# Patient Record
Sex: Male | Born: 1950
Health system: Southern US, Community
[De-identification: ages and names within clinical notes are randomized; demographics above are authoritative.]

## PROBLEM LIST (undated history)

## (undated) DIAGNOSIS — L570 Actinic keratosis: Secondary | ICD-10-CM

## (undated) DIAGNOSIS — H409 Unspecified glaucoma: Secondary | ICD-10-CM

## (undated) HISTORY — DX: Unspecified glaucoma: H40.9

## (undated) HISTORY — DX: Actinic keratosis: L57.0

---

## 2006-03-02 ENCOUNTER — Ambulatory Visit: Payer: Self-pay | Admitting: Gastroenterology

## 2012-05-03 ENCOUNTER — Emergency Department: Payer: Self-pay | Admitting: Emergency Medicine

## 2012-10-10 ENCOUNTER — Emergency Department: Payer: Self-pay | Admitting: Emergency Medicine

## 2012-10-20 ENCOUNTER — Emergency Department: Payer: Self-pay

## 2012-11-20 ENCOUNTER — Emergency Department: Payer: Self-pay | Admitting: Emergency Medicine

## 2013-12-08 ENCOUNTER — Ambulatory Visit: Payer: Self-pay | Admitting: Gastroenterology

## 2013-12-14 LAB — PATHOLOGY REPORT

## 2014-04-12 ENCOUNTER — Ambulatory Visit: Payer: Self-pay | Admitting: Gastroenterology

## 2014-04-12 LAB — CREATININE, SERUM
Creatinine: 1.08 mg/dL (ref 0.60–1.30)
EGFR (African American): >60

## 2015-09-03 ENCOUNTER — Encounter: Payer: Self-pay | Admitting: Family Medicine

## 2015-09-03 ENCOUNTER — Other Ambulatory Visit: Payer: Self-pay | Admitting: Family Medicine

## 2015-09-03 NOTE — Telephone Encounter (Signed)
Letter sent.

## 2015-09-03 NOTE — Telephone Encounter (Signed)
apt 

## 2015-09-20 ENCOUNTER — Other Ambulatory Visit: Payer: Self-pay | Admitting: Family Medicine

## 2015-09-20 MED ORDER — AMLODIPINE BESYLATE 10 MG PO TABS: ORAL_TABLET | ORAL | Status: DC

## 2015-09-20 MED ORDER — LOSARTAN POTASSIUM 50 MG PO TABS: ORAL_TABLET | ORAL | Status: DC

## 2015-09-20 NOTE — Telephone Encounter (Signed)
Routing to provider  

## 2015-09-20 NOTE — Telephone Encounter (Signed)
Pt has an appointment scheduled for 11/06/2015 and would like to have refills for losartan and amlodipine sent to [facility].

## 2015-11-06 ENCOUNTER — Ambulatory Visit (INDEPENDENT_AMBULATORY_CARE_PROVIDER_SITE_OTHER): Payer: BLUE CROSS/BLUE SHIELD | Admitting: Family Medicine

## 2015-11-06 ENCOUNTER — Encounter: Payer: Self-pay | Admitting: Family Medicine

## 2015-11-06 DIAGNOSIS — H409 Unspecified glaucoma: Secondary | ICD-10-CM | POA: Insufficient documentation

## 2015-11-06 DIAGNOSIS — I10 Essential (primary) hypertension: Secondary | ICD-10-CM | POA: Diagnosis not present

## 2015-11-06 MED ORDER — AMLODIPINE BESYLATE 10 MG PO TABS: ORAL_TABLET | ORAL | 2 refills | Status: DC

## 2015-11-06 MED ORDER — LOSARTAN POTASSIUM 50 MG PO TABS: ORAL_TABLET | ORAL | 2 refills | Status: DC

## 2015-11-06 NOTE — Assessment & Plan Note (Signed)
The current medical regimen is effective;  continue present plan and medications.  

## 2015-11-06 NOTE — Addendum Note (Signed)
Addended byGolden Pop on: 11/06/2015 11:20 AM   Modules accepted: Orders

## 2015-11-06 NOTE — Progress Notes (Addendum)
   BP 134/84 (BP Location: Left Arm, Patient Position: Sitting, Cuff Size: Normal)   Pulse 63   Temp 98.1 F (36.7 C)   Ht '5\' 8"'$  (1.727 m)   Wt 189 lb (85.7 kg)   SpO2 97%   BMI 28.74 kg/m    Subjective:    Patient ID: Connor Cruz, male    DOB: Aug 19, 1950, 65 y.o.   MRN: [id number]  HPI: Connor Cruz is a 65 y.o. male  Chief Complaint  Patient presents with  . Hypertension    med refills  Patient follow-up hypertension doing well no complaints from medications taken faithfully without side effects. Good control of blood pressure Glaucoma followed by ophthalmology currently off medications and observing blood pressure and eye response  Relevant past medical, surgical, family and social history reviewed and updated as indicated. Interim medical history since our last visit reviewed. Allergies and medications reviewed and updated.  Review of Systems  Constitutional: Negative.   Respiratory: Negative.   Cardiovascular: Negative.     Per HPI unless specifically indicated above     Objective:    BP 134/84 (BP Location: Left Arm, Patient Position: Sitting, Cuff Size: Normal)   Pulse 63   Temp 98.1 F (36.7 C)   Ht '5\' 8"'$  (1.727 m)   Wt 189 lb (85.7 kg)   SpO2 97%   BMI 28.74 kg/m   Wt Readings from Last 3 Encounters:  11/06/15 189 lb (85.7 kg)  08/07/14 188 lb (85.3 kg)    Physical Exam  Constitutional: He is oriented to person, place, and time. He appears well-developed and well-nourished. No distress.  HENT:  Head: Normocephalic and atraumatic.  Right Ear: Hearing normal.  Left Ear: Hearing normal.  Nose: Nose normal.  Eyes: Conjunctivae and lids are normal. Right eye exhibits no discharge. Left eye exhibits no discharge. No scleral icterus.  Cardiovascular: Normal rate, regular rhythm and normal heart sounds.   Pulmonary/Chest: Effort normal and breath sounds normal. No respiratory distress.  Musculoskeletal: Normal range of motion.    Neurological: He is alert and oriented to person, place, and time.  Skin: Skin is intact. No rash noted.  Psychiatric: He has a normal mood and affect. His speech is normal and behavior is normal. Judgment and thought content normal. Cognition and memory are normal.    Results for orders placed or performed in visit on 04/12/14  Creatinine, serum  Result Value Ref Range   Creatinine 1.08 0.60 - 1.30 mg/dL   EGFR (African American) >60 >24m/min   EGFR (Non-African Amer.) >60 >630mmin      Assessment & Plan:   Problem List Items Addressed This Visit      Cardiovascular and Mediastinum   Essential hypertension    The current medical regimen is effective;  continue present plan and medications.       Relevant Medications   losartan (COZAAR) 50 MG tablet   amLODipine (NORVASC) 10 MG tablet   Other Relevant Orders   Basic metabolic panel     Other   Glaucoma    Followed by eye Dr       Other Visit Diagnoses   None.      Follow up plan: Return for Physical Exam.

## 2015-11-06 NOTE — Assessment & Plan Note (Signed)
Followed by eye Dr 

## 2015-11-07 ENCOUNTER — Telehealth: Payer: Self-pay | Admitting: Family Medicine

## 2015-11-07 DIAGNOSIS — R7309 Other abnormal glucose: Secondary | ICD-10-CM

## 2015-11-07 LAB — BASIC METABOLIC PANEL
BUN/Creatinine Ratio: 11 (ref 10–24)
BUN: 11 mg/dL (ref 8–27)
CO2: 26 mmol/L (ref 18–29)
CREATININE: 1.03 mg/dL (ref 0.76–1.27)
Calcium: 9.4 mg/dL (ref 8.6–10.2)
Chloride: 101 mmol/L (ref 96–106)
GFR calc Af Amer: 89 mL/min/{1.73_m2} (ref >59)
GFR calc non Af Amer: 77 mL/min/{1.73_m2} (ref >59)
GLUCOSE: 141 mg/dL — AB (ref 65–99)
POTASSIUM: 4.2 mmol/L (ref 3.5–5.2)
SODIUM: 140 mmol/L (ref 134–144)

## 2015-11-07 NOTE — Telephone Encounter (Signed)
Phone call Discussed with patient nonfasting glucose is elevated patient admits to eating a lot of sweets. Will check glucose hemoglobin A1c later this month.

## 2016-03-09 ENCOUNTER — Encounter: Payer: Self-pay | Admitting: Family Medicine

## 2016-03-09 ENCOUNTER — Ambulatory Visit (INDEPENDENT_AMBULATORY_CARE_PROVIDER_SITE_OTHER): Payer: BLUE CROSS/BLUE SHIELD | Admitting: Family Medicine

## 2016-03-09 VITALS — BP 122/82 | HR 71 | Temp 98.5°F | Ht 67.7 in | Wt 190.2 lb

## 2016-03-09 DIAGNOSIS — R7309 Other abnormal glucose: Secondary | ICD-10-CM | POA: Diagnosis not present

## 2016-03-09 DIAGNOSIS — Z0000 Encounter for general adult medical examination without abnormal findings: Secondary | ICD-10-CM

## 2016-03-09 DIAGNOSIS — I10 Essential (primary) hypertension: Secondary | ICD-10-CM | POA: Diagnosis not present

## 2016-03-09 LAB — UA/M W/RFLX CULTURE, ROUTINE
BILIRUBIN UA: NEGATIVE
GLUCOSE, UA: NEGATIVE
KETONES UA: NEGATIVE
Leukocytes, UA: NEGATIVE
Nitrite, UA: NEGATIVE
PROTEIN UA: NEGATIVE
Specific Gravity, UA: 1.01 (ref 1.005–1.030)
UUROB: 0.2 mg/dL (ref 0.2–1.0)
pH, UA: 6.5 (ref 5.0–7.5)

## 2016-03-09 LAB — MICROSCOPIC EXAMINATION: WBC UA: NONE SEEN /HPF (ref 0–?)

## 2016-03-09 LAB — BAYER DCA HB A1C WAIVED: HB A1C (BAYER DCA - WAIVED): 5.4 % (ref <7.0)

## 2016-03-09 MED ORDER — AMLODIPINE BESYLATE 10 MG PO TABS
ORAL_TABLET | ORAL | 12 refills | Status: DC
Start: 2016-03-09 — End: 2017-03-12

## 2016-03-09 MED ORDER — ASPIRIN EC 81 MG PO TBEC: 81.0000 mg | DELAYED_RELEASE_TABLET | Freq: Every day | ORAL | 12 refills | Status: AC

## 2016-03-09 MED ORDER — LOSARTAN POTASSIUM 50 MG PO TABS: ORAL_TABLET | ORAL | 12 refills | Status: DC

## 2016-03-09 NOTE — Progress Notes (Signed)
BP 122/82 (BP Location: Left Arm)   Pulse 71   Temp 98.5 F (36.9 C)   Ht 5' 7.7" (1.72 m)   Wt 190 lb 3.2 oz (86.3 kg)   SpO2 99%   BMI 29.18 kg/m    Subjective:    Patient ID: Connor Cruz, male    DOB: Sep 14, 1950, 65 y.o.   MRN: [id number]  HPI: Connor Cruz is a 65 y.o. male  Chief Complaint  Patient presents with  . Annual Exam   Patient follow-up blood pressure doing well no complaints taking medications with good blood pressure and no complaints Blood pressure doing well especially at home and doing okay here did not take medicines today. Also concerned about elevated glucose on blood work done this summer has not had A1c test done yet will do so today. Relevant past medical, surgical, family and social history reviewed and updated as indicated. Interim medical history since our last visit reviewed. Allergies and medications reviewed and updated.  Review of Systems  Constitutional: Negative.   HENT: Negative.   Eyes: Negative.   Respiratory: Negative.   Cardiovascular: Negative.   Gastrointestinal: Negative.   Endocrine: Negative.   Genitourinary: Negative.   Musculoskeletal: Negative.   Skin: Negative.   Allergic/Immunologic: Negative.   Neurological: Negative.   Hematological: Negative.   Psychiatric/Behavioral: Negative.     Per HPI unless specifically indicated above     Objective:    BP 122/82 (BP Location: Left Arm)   Pulse 71   Temp 98.5 F (36.9 C)   Ht 5' 7.7" (1.72 m)   Wt 190 lb 3.2 oz (86.3 kg)   SpO2 99%   BMI 29.18 kg/m   Wt Readings from Last 3 Encounters:  03/09/16 190 lb 3.2 oz (86.3 kg)  11/06/15 189 lb (85.7 kg)  08/07/14 188 lb (85.3 kg)    Physical Exam  Constitutional: He is oriented to person, place, and time. He appears well-developed and well-nourished.  HENT:  Head: Normocephalic and atraumatic.  Right Ear: External ear normal.  Left Ear: External ear normal.  Eyes: Conjunctivae and EOM are  normal. Pupils are equal, round, and reactive to light.  Neck: Normal range of motion. Neck supple.  Cardiovascular: Normal rate, regular rhythm, normal heart sounds and intact distal pulses.   Pulmonary/Chest: Effort normal and breath sounds normal.  Abdominal: Soft. Bowel sounds are normal. There is no splenomegaly or hepatomegaly.  Genitourinary: Rectum normal, prostate normal and penis normal.  Musculoskeletal: Normal range of motion.  Neurological: He is alert and oriented to person, place, and time. He has normal reflexes.  Skin: No rash noted. No erythema.  Psychiatric: He has a normal mood and affect. His behavior is normal. Judgment and thought content normal.    Results for orders placed or performed in visit on [id number]  Basic metabolic panel  Result Value Ref Range   Glucose 141 (H) 65 - 99 mg/dL   BUN 11 8 - 27 mg/dL   Creatinine, Ser 1.03 0.76 - 1.27 mg/dL   GFR calc non Af Amer 77 >59 mL/min/1.73   GFR calc Af Amer 89 >59 mL/min/1.73   BUN/Creatinine Ratio 11 10 - 24   Sodium 140 134 - 144 mmol/L   Potassium 4.2 3.5 - 5.2 mmol/L   Chloride 101 96 - 106 mmol/L   CO2 26 18 - 29 mmol/L   Calcium 9.4 8.6 - 10.2 mg/dL      Assessment & Plan:   Problem List  Items Addressed This Visit      Cardiovascular and Mediastinum   Essential hypertension    The current medical regimen is effective;  continue present plan and medications.       Relevant Medications   losartan (COZAAR) 50 MG tablet   amLODipine (NORVASC) 10 MG tablet   aspirin EC 81 MG tablet     Other   Elevated glucose    We will check hemoglobin A1c      Relevant Orders   Hemoglobin A1c    Other Visit Diagnoses    Annual physical exam    -  Primary   Relevant Orders   CBC with Differential/Platelet   Comprehensive metabolic panel   Lipid panel   UA/M w/rflx Culture, Routine   TSH   PSA       Follow up plan: Return in about 6 months (around 09/07/2016) for BMP.

## 2016-03-09 NOTE — Assessment & Plan Note (Signed)
The current medical regimen is effective;  continue present plan and medications.  

## 2016-03-09 NOTE — Assessment & Plan Note (Signed)
We will check hemoglobin A1c

## 2016-03-10 ENCOUNTER — Encounter: Payer: Self-pay | Admitting: Family Medicine

## 2016-03-10 LAB — CBC WITH DIFFERENTIAL/PLATELET
BASOS: 0 %
Basophils Absolute: 0 10*3/uL (ref 0.0–0.2)
EOS (ABSOLUTE): 0.1 10*3/uL (ref 0.0–0.4)
EOS: 1 %
HEMATOCRIT: 43.3 % (ref 37.5–51.0)
Hemoglobin: 15.6 g/dL (ref 13.0–17.7)
IMMATURE GRANS (ABS): 0 10*3/uL (ref 0.0–0.1)
IMMATURE GRANULOCYTES: 0 %
LYMPHS: 33 %
Lymphocytes Absolute: 2 10*3/uL (ref 0.7–3.1)
MCH: 29.9 pg (ref 26.6–33.0)
MCHC: 36 g/dL — ABNORMAL HIGH (ref 31.5–35.7)
MCV: 83 fL (ref 79–97)
MONOS ABS: 0.5 10*3/uL (ref 0.1–0.9)
Monocytes: 8 %
NEUTROS PCT: 58 %
Neutrophils Absolute: 3.5 10*3/uL (ref 1.4–7.0)
PLATELETS: 181 10*3/uL (ref 150–379)
RBC: 5.22 x10E6/uL (ref 4.14–5.80)
RDW: 14.2 % (ref 12.3–15.4)
WBC: 6.1 10*3/uL (ref 3.4–10.8)

## 2016-03-10 LAB — TSH: TSH: 2.45 u[IU]/mL (ref 0.450–4.500)

## 2016-03-10 LAB — COMPREHENSIVE METABOLIC PANEL
ALT: 31 IU/L (ref 0–44)
AST: 23 IU/L (ref 0–40)
Albumin/Globulin Ratio: 1.7 (ref 1.2–2.2)
Albumin: 4.2 g/dL (ref 3.6–4.8)
Alkaline Phosphatase: 87 IU/L (ref 39–117)
BUN/Creatinine Ratio: 13 (ref 10–24)
BUN: 13 mg/dL (ref 8–27)
Bilirubin Total: 0.7 mg/dL (ref 0.0–1.2)
CALCIUM: 9.4 mg/dL (ref 8.6–10.2)
CO2: 25 mmol/L (ref 18–29)
CREATININE: 1.01 mg/dL (ref 0.76–1.27)
Chloride: 101 mmol/L (ref 96–106)
GFR, EST AFRICAN AMERICAN: 90 mL/min/{1.73_m2} (ref >59)
GFR, EST NON AFRICAN AMERICAN: 78 mL/min/{1.73_m2} (ref >59)
Globulin, Total: 2.5 g/dL (ref 1.5–4.5)
Glucose: 103 mg/dL — ABNORMAL HIGH (ref 65–99)
Potassium: 4.2 mmol/L (ref 3.5–5.2)
Sodium: 140 mmol/L (ref 134–144)
TOTAL PROTEIN: 6.7 g/dL (ref 6.0–8.5)

## 2016-03-10 LAB — PSA: PROSTATE SPECIFIC AG, SERUM: 1.3 ng/mL (ref 0.0–4.0)

## 2016-03-10 LAB — LIPID PANEL
CHOL/HDL RATIO: 3.3 ratio (ref 0.0–5.0)
Cholesterol, Total: 156 mg/dL (ref 100–199)
HDL: 47 mg/dL (ref >39)
LDL CALC: 81 mg/dL (ref 0–99)
TRIGLYCERIDES: 140 mg/dL (ref 0–149)
VLDL CHOLESTEROL CAL: 28 mg/dL (ref 5–40)

## 2016-09-07 ENCOUNTER — Ambulatory Visit: Payer: BLUE CROSS/BLUE SHIELD | Admitting: Family Medicine

## 2017-03-12 ENCOUNTER — Other Ambulatory Visit: Payer: Self-pay | Admitting: Family Medicine

## 2017-03-12 DIAGNOSIS — I10 Essential (primary) hypertension: Secondary | ICD-10-CM

## 2017-09-27 ENCOUNTER — Encounter: Payer: Self-pay | Admitting: Emergency Medicine

## 2017-09-27 ENCOUNTER — Emergency Department
Admission: EM | Admit: 2017-09-27 | Discharge: 2017-09-27 | Disposition: A | Discharge status: Home / Self Care | Payer: Medicare HMO | Attending: Emergency Medicine | Admitting: Emergency Medicine

## 2017-09-27 ENCOUNTER — Emergency Department: Payer: Medicare HMO

## 2017-09-27 DIAGNOSIS — Z7982 Long term (current) use of aspirin: Secondary | ICD-10-CM | POA: Insufficient documentation

## 2017-09-27 DIAGNOSIS — I10 Essential (primary) hypertension: Secondary | ICD-10-CM | POA: Insufficient documentation

## 2017-09-27 DIAGNOSIS — M25531 Pain in right wrist: Secondary | ICD-10-CM | POA: Insufficient documentation

## 2017-09-27 DIAGNOSIS — Z79899 Other long term (current) drug therapy: Secondary | ICD-10-CM | POA: Insufficient documentation

## 2017-09-27 MED ORDER — NAPROXEN 500 MG PO TABS: 500.0000 mg | ORAL_TABLET | Freq: Two times a day (BID) | ORAL | Status: DC

## 2017-09-27 MED ORDER — NAPROXEN 500 MG PO TABS
500.0000 mg | ORAL_TABLET | Freq: Once | ORAL | Status: AC
  Administered 2017-09-27: 500 mg via ORAL
  Filled 2017-09-27: qty 1

## 2017-09-27 MED ORDER — TRAMADOL HCL 50 MG PO TABS: 50.0000 mg | ORAL_TABLET | Freq: Two times a day (BID) | ORAL | 0 refills | Status: DC | PRN

## 2017-09-27 NOTE — Discharge Instructions (Addendum)
Wear splint until evaluation by orthopedics. °

## 2017-09-27 NOTE — ED Provider Notes (Signed)
[facility] Emergency Department Provider Note   ____________________________________________   First MD Initiated Contact with Patient 09/27/17 1344     (approximate)  I have reviewed the triage vital signs and the nursing notes.   HISTORY  Chief Complaint Wrist Pain    HPI Connor Cruz is a 67 y.o. male complain of right wrist pain with no provocative incident.  Patient admits to a fracture greater than 15 years ago.  Patient states pain has increased in the past 2 to 3 weeks.  Patient had pain increases with gripping, flexion, extension of the wrist.  Patient rates the pain as a 3/10.  Patient described the pain is "achy".  No palliative measures for complaint.  Past Medical History:  Diagnosis Date  . Glaucoma     Patient Active Problem List   Diagnosis Date Noted  . Elevated glucose 03/09/2016  . Essential hypertension 11/06/2015  . Glaucoma 11/06/2015    History reviewed. No pertinent surgical history.  Prior to Admission medications   Medication Sig Start Date End Date Taking? Authorizing Provider  amLODipine (NORVASC) 10 MG tablet TAKE ONE (1) TABLET BY MOUTH EVERY DAY 03/12/17   Guadalupe Maple, MD  aspirin EC 81 MG tablet Take 1 tablet (81 mg total) by mouth daily. 03/09/16   Guadalupe Maple, MD  losartan (COZAAR) 50 MG tablet TAKE ONE (1) TABLET BY MOUTH EVERY DAY 03/12/17   Guadalupe Maple, MD  naproxen (NAPROSYN) 500 MG tablet Take 1 tablet (500 mg total) by mouth 2 (two) times daily with a meal. 09/27/17   Sable Feil, PA-C  traMADol (ULTRAM) 50 MG tablet Take 1 tablet (50 mg total) by mouth every 12 (twelve) hours as needed. 09/27/17   Sable Feil, PA-C    Allergies Benazepril and Penicillins  Family History  Problem Relation Age of Onset  . Hypertension Mother   . Stroke Mother   . Diabetes Father   . Hypertension Father     Social History Social History   Tobacco Use  . Smoking status: Never Smoker    . Smokeless tobacco: Never Used  Substance Use Topics  . Alcohol use: Yes    Comment: very limited  . Drug use: No    Review of Systems Constitutional: No fever/chills Eyes: No visual changes. ENT: No sore throat. Cardiovascular: Denies chest pain. Respiratory: Denies shortness of breath. Gastrointestinal: No abdominal pain.  No nausea, no vomiting.  No diarrhea.  No constipation. Genitourinary: Negative for dysuria. Musculoskeletal: Right wrist pain. Skin: Negative for rash. Neurological: Negative for headaches, focal weakness or numbness.   ____________________________________________   PHYSICAL EXAM:  VITAL SIGNS: ED Triage Vitals [09/27/17 1337]  Enc Vitals Group     BP      Pulse      Resp      Temp      Temp src      SpO2      Weight 190 lb (86.2 kg)     Height 5\' 9"  (1.753 m)     Head Circumference      Peak Flow      Pain Score 3     Pain Loc      Pain Edu?      Excl. in [place]?     Constitutional: Alert and oriented. Well appearing and in no acute distress. Cardiovascular: Normal rate, regular rhythm. Grossly normal heart sounds.  Good peripheral circulation. Respiratory: Normal respiratory effort.  No retractions. Lungs  CTAB. Musculoskeletal: No obvious deformity to the right wrist.  Patient has a bony lesion at the distal ulna.. Neurologic:  Normal speech and language. No gross focal neurologic deficits are appreciated. No gait instability. Skin:  Skin is warm, dry and intact. No rash noted. Psychiatric: Mood and affect are normal. Speech and behavior are normal.  ____________________________________________   LABS (all labs ordered are listed, but only abnormal results are displayed)  Labs Reviewed - No data to display ____________________________________________  EKG   ____________________________________________  RADIOLOGY  ED MD interpretation:    Official radiology report(s): Dg Wrist Complete Right  Result Date:  09/27/2017 CLINICAL DATA:  Pain to RIGHT wrist.  No injury. EXAM: RIGHT WRIST - COMPLETE 3+ VIEW COMPARISON:  None. FINDINGS: There is a chronic healed fracture of the distal radius. There is joint space narrowing the radiocarpal and ulnocarpal joints. Marked degenerative change with bony overgrowth, possible healed fracture, is seen of the distal ulna. Subchondral cyst can be seen in the navicular. No acute fracture is evident. There is soft tissue swelling. IMPRESSION: Chronic degenerative change as described. Old distal radius fracture. Soft tissue swelling. Electronically Signed   By: Staci Righter M.D.   On: 09/27/2017 14:25    ____________________________________________   PROCEDURES  Procedure(s) performed: None  Procedures  Critical Care performed: No  ____________________________________________   INITIAL IMPRESSION / ASSESSMENT AND PLAN / ED COURSE  As part of my medical decision making, I reviewed the following data within the electronic MEDICAL RECORD NUMBER    Right wrist pain secondary to degenerative and bony overgrowth from old fracture.  Discussed x-ray findings with patient.  Patient placed in a volar splint and given anti-inflammatory medication.  Patient advised follow-up with orthopedic for definitive evaluation and treatment.     ____________________________________________   FINAL CLINICAL IMPRESSION(S) / ED DIAGNOSES  Final diagnoses:  Right wrist pain     ED Discharge Orders        Ordered    naproxen (NAPROSYN) 500 MG tablet  2 times daily with meals     09/27/17 1439    traMADol (ULTRAM) 50 MG tablet  Every 12 hours PRN     09/27/17 1439       Note:  This document was prepared using Dragon voice recognition software and may include unintentional dictation errors.    Sable Feil, PA-C 09/27/17 1457    Earleen Newport, MD 09/27/17 [phone]

## 2017-09-27 NOTE — ED Triage Notes (Signed)
Pt c/o pain to right wrist. Pt denies obvious injuries. Pt reports broke it years ago.

## 2017-09-27 NOTE — ED Provider Notes (Signed)
[facility] Emergency Department Provider Note   ____________________________________________   First MD Initiated Contact with Patient 09/27/17 1344     (approximate)  I have reviewed the triage vital signs and the nursing notes.   HISTORY  Chief Complaint Wrist Pain    HPI Connor Cruz is a 67 y.o. male patient complain of right wrist pain for 3 to 4 weeks.  Patient denies provocative complaint.  Patient does have a nodule lesion distal right ulnar which he says secondary to a fracture many years ago.  Patient that he believes the bone did not heal correctly years ago.  Patient is right-hand dominant.  Patient rates the pain as a 3/10.  Patient states pain increases to a 7/10 with use of the right hand.  No palliative measures for complaint.   Past Medical History:  Diagnosis Date  . Glaucoma     Patient Active Problem List   Diagnosis Date Noted  . Elevated glucose 03/09/2016  . Essential hypertension 11/06/2015  . Glaucoma 11/06/2015    History reviewed. No pertinent surgical history.  Prior to Admission medications   Medication Sig Start Date End Date Taking? Authorizing Provider  amLODipine (NORVASC) 10 MG tablet TAKE ONE (1) TABLET BY MOUTH EVERY DAY 03/12/17   Guadalupe Maple, MD  aspirin EC 81 MG tablet Take 1 tablet (81 mg total) by mouth daily. 03/09/16   Guadalupe Maple, MD  losartan (COZAAR) 50 MG tablet TAKE ONE (1) TABLET BY MOUTH EVERY DAY 03/12/17   Guadalupe Maple, MD  naproxen (NAPROSYN) 500 MG tablet Take 1 tablet (500 mg total) by mouth 2 (two) times daily with a meal. 09/27/17   Sable Feil, PA-C  traMADol (ULTRAM) 50 MG tablet Take 1 tablet (50 mg total) by mouth every 12 (twelve) hours as needed. 09/27/17   Sable Feil, PA-C    Allergies Benazepril and Penicillins  Family History  Problem Relation Age of Onset  . Hypertension Mother   . Stroke Mother   . Diabetes Father   . Hypertension Father      Social History Social History   Tobacco Use  . Smoking status: Never Smoker  . Smokeless tobacco: Never Used  Substance Use Topics  . Alcohol use: Yes    Comment: very limited  . Drug use: No    Review of Systems Constitutional: No fever/chills Eyes: No visual changes. ENT: No sore throat. Cardiovascular: Denies chest pain. Respiratory: Denies shortness of breath. Gastrointestinal: No abdominal pain.  No nausea, no vomiting.  No diarrhea.  No constipation. Genitourinary: Negative for dysuria. Musculoskeletal: No obvious deformity to the right wrist. Skin: Negative for rash.  Nodule lesion distal ulna. Neurological: Negative for headaches, focal weakness or numbness. Allergic/Immunilogical: Penicillin and Benzapril. ____________________________________________   PHYSICAL EXAM:  VITAL SIGNS: ED Triage Vitals [09/27/17 1337]  Enc Vitals Group     BP      Pulse      Resp      Temp      Temp src      SpO2      Weight 190 lb (86.2 kg)     Height 5\' 9"  (1.753 m)     Head Circumference      Peak Flow      Pain Score 3     Pain Loc      Pain Edu?      Excl. in [place]?    Constitutional: Alert and oriented. Well appearing  and in no acute distress. Hematological/Lymphatic/Immunilogical: No cervical lymphadenopathy. Cardiovascular: Normal rate, regular rhythm. Grossly normal heart sounds.  Good peripheral circulation. Respiratory: Normal respiratory effort.  No retractions. Lungs CTAB. Musculoskeletal: No obvious deformity to the right wrist.  There is nodule lesion at the distal ulna. Neurologic:  Normal speech and language. No gross focal neurologic deficits are appreciated. No gait instability. Skin:  Skin is warm, dry and intact. No rash noted. Psychiatric: Mood and affect are normal. Speech and behavior are normal.  ____________________________________________   LABS (all labs ordered are listed, but only abnormal results are displayed)  Labs Reviewed - No data  to display ____________________________________________  EKG   ____________________________________________  RADIOLOGY  ED MD interpretation:    Official radiology report(s): Dg Wrist Complete Right  Result Date: 09/27/2017 CLINICAL DATA:  Pain to RIGHT wrist.  No injury. EXAM: RIGHT WRIST - COMPLETE 3+ VIEW COMPARISON:  None. FINDINGS: There is a chronic healed fracture of the distal radius. There is joint space narrowing the radiocarpal and ulnocarpal joints. Marked degenerative change with bony overgrowth, possible healed fracture, is seen of the distal ulna. Subchondral cyst can be seen in the navicular. No acute fracture is evident. There is soft tissue swelling. IMPRESSION: Chronic degenerative change as described. Old distal radius fracture. Soft tissue swelling. Electronically Signed   By: Staci Righter M.D.   On: 09/27/2017 14:25    ____________________________________________   PROCEDURES  Procedure(s) performed: None  Procedures  Critical Care performed: No  ____________________________________________   INITIAL IMPRESSION / ASSESSMENT AND PLAN / ED COURSE  As part of my medical decision making, I reviewed the following data within the electronic MEDICAL RECORD NUMBER    Right wrist pain secondary to degenerative changes and bony overgrowth of the distal ulnar.  Discussed x-ray findings with patient.  Patient placed in a splint and advised to follow orthopedic for definitive evaluation and treatment.      ____________________________________________   FINAL CLINICAL IMPRESSION(S) / ED DIAGNOSES  Final diagnoses:  Right wrist pain     ED Discharge Orders        Ordered    naproxen (NAPROSYN) 500 MG tablet  2 times daily with meals     09/27/17 1439    traMADol (ULTRAM) 50 MG tablet  Every 12 hours PRN     09/27/17 1439       Note:  This document was prepared using Dragon voice recognition software and may include unintentional dictation errors.     Sable Feil, PA-C 09/27/17 1440    Earleen Newport, MD 09/27/17 [phone]

## 2017-09-27 NOTE — ED Notes (Signed)
See triage note  Presents with pain to right wrist  Denies any new injury  States he had broken that wrist about 25 -30 years ago  Over the past few days pain has increased   Positive swelling    No deformity noted  Good pulses

## 2017-09-30 DIAGNOSIS — M19031 Primary osteoarthritis, right wrist: Secondary | ICD-10-CM | POA: Diagnosis not present

## 2018-04-14 ENCOUNTER — Other Ambulatory Visit: Payer: Self-pay

## 2018-04-14 DIAGNOSIS — I10 Essential (primary) hypertension: Secondary | ICD-10-CM

## 2018-05-06 ENCOUNTER — Other Ambulatory Visit: Payer: Self-pay | Admitting: Family Medicine

## 2018-05-06 DIAGNOSIS — I10 Essential (primary) hypertension: Secondary | ICD-10-CM

## 2018-05-06 NOTE — Telephone Encounter (Signed)
Called patient Scheduled for CPE. 07/13/2018. Last physical 11/06/15.

## 2018-05-06 NOTE — Telephone Encounter (Signed)
Requested medication (s) are due for refill today: yes  Requested medication (s) are on the active medication list: yes  Last refill:  03/12/17 expired  Future visit scheduled: yes CPE scheduled for 07/13/2018  Notes to clinic: expired RX    Requested Prescriptions  Pending Prescriptions Disp Refills   amLODipine (NORVASC) 10 MG tablet 90 tablet 0     Cardiovascular:  Calcium Channel Blockers Failed - 05/06/2018  2:11 PM      Failed - Valid encounter within last 6 months    Recent Outpatient Visits          2 years ago Annual physical exam   [facility] Guadalupe Maple, MD   2 years ago Essential hypertension   [place] [facility], Jeannette How, MD      Future Appointments            In 2 months [facility], Jeannette How, MD [place]           Passed - Last BP in normal range    BP Readings from Last 1 Encounters:  09/27/17 132/80        losartan (COZAAR) 50 MG tablet 30 tablet 12     Cardiovascular:  Angiotensin Receptor Blockers Failed - 05/06/2018  2:11 PM      Failed - Cr in normal range and within 180 days    Creatinine  Date Value Ref Range Status  04/12/2014 1.08 0.60 - 1.30 mg/dL Final   Creatinine, Ser  Date Value Ref Range Status  03/09/2016 1.01 0.76 - 1.27 mg/dL Final         Failed - K in normal range and within 180 days    Potassium  Date Value Ref Range Status  03/09/2016 4.2 3.5 - 5.2 mmol/L Final         Failed - Valid encounter within last 6 months    Recent Outpatient Visits          2 years ago Annual physical exam   [facility] Guadalupe Maple, MD   2 years ago Essential hypertension   [place], Jeannette How, MD      Future Appointments            In 2 months [facility], Jeannette How, MD [place], [place] - Patient is not pregnant      Passed - Last BP in normal range    BP Readings from Last 1 Encounters:  09/27/17 132/80

## 2018-05-06 NOTE — Telephone Encounter (Signed)
Copied from [place] [id number]. Topic: Quick Communication - Rx Refill/Question >> May 06, 2018  1:47 PM Windy Kalata wrote: Medication: amLODipine (NORVASC) 10 MG tablet,losartan (COZAAR) 50 MG tablet  Has the patient contacted their pharmacy? Yes.   (Agent: If no, request that the patient contact the pharmacy for the refill.) (Agent: If yes, when and what did the pharmacy advise?) Call office for refills  Preferred Pharmacy (with phone number or street name): [place], [place] - [place] [phone] (Phone) [phone] (Fax)    Agent: Please be advised that RX refills may take up to 3 business days. We ask that you follow-up with your pharmacy.

## 2018-05-08 MED ORDER — LOSARTAN POTASSIUM 50 MG PO TABS: 50.0000 mg | ORAL_TABLET | Freq: Every day | ORAL | 1 refills | Status: DC

## 2018-05-08 MED ORDER — AMLODIPINE BESYLATE 10 MG PO TABS: 10.0000 mg | ORAL_TABLET | Freq: Every day | ORAL | 0 refills | Status: DC

## 2018-07-13 ENCOUNTER — Other Ambulatory Visit: Payer: Self-pay

## 2018-07-13 ENCOUNTER — Ambulatory Visit (INDEPENDENT_AMBULATORY_CARE_PROVIDER_SITE_OTHER): Payer: Medicare HMO | Admitting: Family Medicine

## 2018-07-13 ENCOUNTER — Encounter: Payer: Self-pay | Admitting: Family Medicine

## 2018-07-13 DIAGNOSIS — I10 Essential (primary) hypertension: Secondary | ICD-10-CM | POA: Diagnosis not present

## 2018-07-13 MED ORDER — AMLODIPINE BESYLATE 10 MG PO TABS: 10.0000 mg | ORAL_TABLET | Freq: Every day | ORAL | 1 refills | Status: DC

## 2018-07-13 MED ORDER — LOSARTAN POTASSIUM 50 MG PO TABS: 50.0000 mg | ORAL_TABLET | Freq: Every day | ORAL | 1 refills | Status: DC

## 2018-07-13 NOTE — Progress Notes (Signed)
Ht 5\' 9"  (1.753 m)   Wt 195 lb (88.5 kg)   BMI 28.80 kg/m    Subjective:    Patient ID: Connor Cruz, male    DOB: 07/07/1950, 68 y.o.   MRN: [id number]  HPI: [name] is a 68 y.o. male  Chief Complaint  Patient presents with  . Hypertension  Telemedicine using audio/video telecommunications for a synchronous communication visit. Today's visit due to COVID-19 isolation precautions I connected with and verified that I am speaking with the correct person using two identifiers.   I discussed the limitations, risks, security and privacy concerns of performing an evaluation and management service by telecommunication and the availability of in person appointments. I also discussed with the patient that there may be a patient responsible charge related to this service. The patient expressed understanding and agreed to proceed. The patient's location is home. I am at home. Discussed with patient hypertension doing well no problems with medications. Reviewed naprosyn which pt took for his wrist with resolution. Discussed OTC use of aleve.  Relevant past medical, surgical, family and social history reviewed and updated as indicated. Interim medical history since our last visit reviewed. Allergies and medications reviewed and updated.  Review of Systems  Constitutional: Negative.   Respiratory: Negative.   Cardiovascular: Negative.     Per HPI unless specifically indicated above     Objective:    Ht 5\' 9"  (1.753 m)   Wt 195 lb (88.5 kg)   BMI 28.80 kg/m   Wt Readings from Last 3 Encounters:  07/13/18 195 lb (88.5 kg)  09/27/17 190 lb (86.2 kg)  03/09/16 190 lb 3.2 oz (86.3 kg)    Physical Exam  Results for orders placed or performed in visit on 03/09/16  Microscopic Examination  Result Value Ref Range   WBC, UA None seen 0 - 5 /hpf   RBC, UA 0-2 0 - 2 /hpf   Epithelial Cells (non renal) 0-10 0 - 10 /hpf  CBC with Differential/Platelet  Result  Value Ref Range   WBC 6.1 3.4 - 10.8 x10E3/uL   RBC 5.22 4.14 - 5.80 x10E6/uL   Hemoglobin 15.6 13.0 - 17.7 g/dL   Hematocrit 43.3 37.5 - 51.0 %   MCV 83 79 - 97 fL   MCH 29.9 26.6 - 33.0 pg   MCHC 36.0 (H) 31.5 - 35.7 g/dL   RDW 14.2 12.3 - 15.4 %   Platelets 181 150 - 379 x10E3/uL   Neutrophils 58 Not Estab. %   Lymphs 33 Not Estab. %   Monocytes 8 Not Estab. %   Eos 1 Not Estab. %   Basos 0 Not Estab. %   Neutrophils Absolute 3.5 1.4 - 7.0 x10E3/uL   Lymphocytes Absolute 2.0 0.7 - 3.1 x10E3/uL   Monocytes Absolute 0.5 0.1 - 0.9 x10E3/uL   EOS (ABSOLUTE) 0.1 0.0 - 0.4 x10E3/uL   Basophils Absolute 0.0 0.0 - 0.2 x10E3/uL   Immature Granulocytes 0 Not Estab. %   Immature Grans (Abs) 0.0 0.0 - 0.1 x10E3/uL  Comprehensive metabolic panel  Result Value Ref Range   Glucose 103 (H) 65 - 99 mg/dL   BUN 13 8 - 27 mg/dL   Creatinine, Ser 1.01 0.76 - 1.27 mg/dL   GFR calc non Af Amer 78 >59 mL/min/1.73   GFR calc Af Amer 90 >59 mL/min/1.73   BUN/Creatinine Ratio 13 10 - 24   Sodium 140 134 - 144 mmol/L   Potassium 4.2 3.5 - 5.2  mmol/L   Chloride 101 96 - 106 mmol/L   CO2 25 18 - 29 mmol/L   Calcium 9.4 8.6 - 10.2 mg/dL   Total Protein 6.7 6.0 - 8.5 g/dL   Albumin 4.2 3.6 - 4.8 g/dL   Globulin, Total 2.5 1.5 - 4.5 g/dL   Albumin/Globulin Ratio 1.7 1.2 - 2.2   Bilirubin Total 0.7 0.0 - 1.2 mg/dL   Alkaline Phosphatase 87 39 - 117 IU/L   AST 23 0 - 40 IU/L   ALT 31 0 - 44 IU/L  Lipid panel  Result Value Ref Range   Cholesterol, Total 156 100 - 199 mg/dL   Triglycerides 140 0 - 149 mg/dL   HDL 47 >39 mg/dL   VLDL Cholesterol Cal 28 5 - 40 mg/dL   LDL Calculated 81 0 - 99 mg/dL   Chol/HDL Ratio 3.3 0.0 - 5.0 ratio units  UA/M w/rflx Culture, Routine  Result Value Ref Range   Specific Gravity, UA 1.010 1.005 - 1.030   pH, UA 6.5 5.0 - 7.5   Color, UA Yellow Yellow   Appearance Ur Clear Clear   Leukocytes, UA Negative Negative   Protein, UA Negative Negative/Trace    Glucose, UA Negative Negative   Ketones, UA Negative Negative   RBC, UA Trace (A) Negative   Bilirubin, UA Negative Negative   Urobilinogen, Ur 0.2 0.2 - 1.0 mg/dL   Nitrite, UA Negative Negative   Microscopic Examination See below:   TSH  Result Value Ref Range   TSH 2.450 0.450 - 4.500 uIU/mL  PSA  Result Value Ref Range   Prostate Specific Ag, Serum 1.3 0.0 - 4.0 ng/mL  Bayer DCA Hb A1c Waived  Result Value Ref Range   HB A1C (BAYER DCA - WAIVED) 5.4 <7.0 %      Assessment & Plan:   Problem List Items Addressed This Visit      Cardiovascular and Mediastinum   Essential hypertension    The current medical regimen is effective;  continue present plan and medications.       Relevant Medications   amLODipine (NORVASC) 10 MG tablet   losartan (COZAAR) 50 MG tablet      I discussed the assessment and treatment plan with the patient. The patient was provided an opportunity to ask questions and all were answered. The patient agreed with the plan and demonstrated an understanding of the instructions.   The patient was advised to call back or seek an in-person evaluation if the symptoms worsen or if the condition fails to improve as anticipated.   I provided 21+ minutes of time during this encounter. Discussed wrist pain care and treatment Discussed COVID-19 care and treatment Follow up plan: Return in about 3 months (around 10/12/2018) for Physical Exam.

## 2018-07-13 NOTE — Assessment & Plan Note (Signed)
The current medical regimen is effective;  continue present plan and medications.  

## 2018-09-01 ENCOUNTER — Telehealth: Payer: Self-pay

## 2018-09-01 NOTE — Telephone Encounter (Signed)
Patient scheduled for an AWV on 09/08/2018 with [facility], Due to Covid-19 pandemic this is unable to be done in office, called patient to see if they are able to do this virtually/telephonically. Left message for patient to call back.  Direct call back [phone]

## 2018-09-08 ENCOUNTER — Ambulatory Visit: Payer: Self-pay

## 2018-10-06 ENCOUNTER — Encounter: Payer: BLUE CROSS/BLUE SHIELD | Admitting: Family Medicine

## 2018-12-07 ENCOUNTER — Telehealth: Payer: Self-pay | Admitting: Family Medicine

## 2018-12-07 NOTE — Telephone Encounter (Signed)
°  Called patient to schedule Medicare Annual Wellness Visit with Nurse Health Advisor, Jerome. If patient returns call, please schedule AWV with N[facility] ~Any date on [facility] schedule~  Questions regarding scheduling, please call  [phone] or Skype > [email or web address]   [email or web address]   [id number]   Skype

## 2018-12-19 ENCOUNTER — Ambulatory Visit (INDEPENDENT_AMBULATORY_CARE_PROVIDER_SITE_OTHER): Payer: Medicare HMO

## 2018-12-19 VITALS — BP 138/86 | HR 59 | Temp 97.8°F | Ht 69.0 in | Wt 192.0 lb

## 2018-12-19 DIAGNOSIS — Z0000 Encounter for general adult medical examination without abnormal findings: Secondary | ICD-10-CM

## 2018-12-19 NOTE — Progress Notes (Signed)
Subjective:   Connor Cruz is a 68 y.o. male who presents for an Initial Medicare Annual Wellness Visit.  This visit is being conducted via phone call  - after an attmept to do on video chat - due to the COVID-19 pandemic. This patient has given me verbal consent via phone to conduct this visit, patient states they are participating from their home address. Some vital signs may be absent or patient reported.   Patient identification: identified by name, DOB, and current address.    Review of Systems   Cardiac Risk Factors include: advanced age (>49men, >81 women);male gender;hypertension    Objective:    Today's Vitals   12/19/18 0827  BP: 138/86  Pulse: (!) 59  Temp: 97.8 F (36.6 C)  Weight: 192 lb (87.1 kg)  Height: 5\' 9"  (1.753 m)   Body mass index is 28.35 kg/m.  Advanced Directives 12/19/2018 09/27/2017  Does Patient Have a Medical Advance Directive? Yes No  Type of Advance Directive Living will -  Would patient like information on creating a medical advance directive? - No - Patient declined    Current Medications (verified) Outpatient Encounter Medications as of 12/19/2018  Medication Sig  . amLODipine (NORVASC) 10 MG tablet Take 1 tablet (10 mg total) by mouth daily.  Marland Kitchen losartan (COZAAR) 50 MG tablet Take 1 tablet (50 mg total) by mouth daily.  Marland Kitchen aspirin EC 81 MG tablet Take 1 tablet (81 mg total) by mouth daily. (Patient not taking: Reported on 12/19/2018)   No facility-administered encounter medications on file as of 12/19/2018.     Allergies (verified) Benazepril and Penicillins   History: Past Medical History:  Diagnosis Date  . Glaucoma    History reviewed. No pertinent surgical history. Family History  Problem Relation Age of Onset  . Hypertension Mother   . Stroke Mother   . Diabetes Father   . Hypertension Father    Social History   Socioeconomic History  . Marital status: Single    Spouse name: Not on file  . Number of  children: Not on file  . Years of education: Not on file  . Highest education level: Not on file  Occupational History  . Not on file  Social Needs  . Financial resource strain: Not hard at all  . Food insecurity    Worry: Never true    Inability: Never true  . Transportation needs    Medical: No    Non-medical: No  Tobacco Use  . Smoking status: Never Smoker  . Smokeless tobacco: Never Used  Substance and Sexual Activity  . Alcohol use: Yes    Comment: very limited  . Drug use: No  . Sexual activity: Not on file  Lifestyle  . Physical activity    Days per week: 0 days    Minutes per session: 0 min  . Stress: Not at all  Relationships  . Social connections    Talks on phone: More than three times a week    Gets together: More than three times a week    Attends religious service: Never    Active member of club or organization: No    Attends meetings of clubs or organizations: Never    Relationship status: Never married  Other Topics Concern  . Not on file  Social History Narrative  . Not on file   Tobacco Counseling Counseling given: Not Answered   Clinical Intake:  Pre-visit preparation completed: Yes  Pain : No/denies pain  Nutritional Risks: None Diabetes: No  How often do you need to have someone help you when you read instructions, pamphlets, or other written materials from your doctor or pharmacy?: 1 - Never  Interpreter Needed?: No  Information entered by :: Connor Seda,LPN  Activities of Daily Living In your present state of health, do you have any difficulty performing the following activities: 12/19/2018  Hearing? N  Comment no hearing aids  Vision? N  Comment reading glasses, goes to [facility] if needed  Difficulty concentrating or making decisions? N  Walking or climbing stairs? N  Dressing or bathing? N  Doing errands, shopping? N  Preparing Food and eating ? N  Using the Toilet? N  In the past six months, have you  accidently leaked urine? N  Do you have problems with loss of bowel control? N  Managing your Medications? N  Managing your Finances? N  Housekeeping or managing your Housekeeping? N  Some recent data might be hidden     Immunizations and Health Maintenance Immunization History  Administered Date(s) Administered  . Tdap 10/10/2012   Health Maintenance Due  Topic Date Due  . Hepatitis C Screening  1950/05/20    Patient Care Team: Connor Maple, MD as PCP - General (Family Medicine)  Indicate any recent Medical Services you may have received from other than [facility] providers in the past year (date may be approximate).    Assessment:   This is a routine wellness examination for Connor Cruz.  Hearing/Vision screen No exam data present  Dietary issues and exercise activities discussed: Current Exercise Habits: The patient does not participate in regular exercise at present, Exercise limited by: None identified  Goals   None    Depression Screen PHQ 2/9 Scores 12/19/2018 03/09/2016  PHQ - 2 Score 0 0  PHQ- 9 Score - 0    Fall Risk Fall Risk  12/19/2018 03/09/2016  Falls in the past year? 0 No    FALL RISK PREVENTION PERTAINING TO THE HOME:  Any stairs in or around the home? No  If so, are there any without handrails? n/a  Home free of loose throw rugs in walkways, pet beds, electrical cords, etc? Yes  Adequate lighting in your home to reduce risk of falls? Yes   ASSISTIVE DEVICES UTILIZED TO PREVENT FALLS:  Life alert? No  Use of a cane, walker or w/c? No  Grab bars in the bathroom? No  Shower chair or bench in shower? No  Elevated toilet seat or a handicapped toilet? No    Cognitive Function:        Screening Tests Health Maintenance  Topic Date Due  . Hepatitis C Screening  [birth year]  . INFLUENZA VACCINE  07/05/2019 (Originally 11/05/2018)  . PNA vac Low Risk Adult (1 of 2 - PCV13) 12/19/2019 (Originally 12/05/2016)  . TETANUS/TDAP  10/11/2022  .  COLONOSCOPY  12/24/2023    Qualifies for Shingles Vaccine? Yes  Zostavax completed n/a. Due for Shingrix. Education has been provided regarding the importance of this vaccine. Pt has been advised to call insurance company to determine out of pocket expense. Advised may also receive vaccine at local pharmacy or Health Dept. Verbalized acceptance and understanding.  Tdap: up to date  Flu Vaccine: declined   Pneumococcal Vaccine: Due for Pneumococcal vaccine. Does the patient want to receive this vaccine today?  No . Education has been provided regarding the importance of this vaccine but still declined. Advised may receive this vaccine at local pharmacy  or Health Dept. Aware to provide a copy of the vaccination record if obtained from local pharmacy or Health Dept. Verbalized acceptance and understanding.   Cancer Screenings:  Colorectal Screening: Completed 12/23/2013. Repeat every 10 years   Lung Cancer Screening: (Low Dose CT Chest recommended if Age 66-80 years, 30 pack-year currently smoking OR have quit w/in 15years.) does not qualify.     Additional Screening:  Hepatitis C Screening: does qualify; will complete at next in office visit     Dental Screening: Recommended annual dental exams for proper oral hygiene  Community Resource Referral:  CRR required this visit?  No        Plan:  I have personally reviewed and addressed the Medicare Annual Wellness questionnaire and have noted the following in the patient's chart:  A. Medical and social history B. Use of alcohol, tobacco or illicit drugs  C. Current medications and supplements D. Functional ability and status E.  Nutritional status F.  Physical activity G. Advance directives H. List of other physicians I.  Hospitalizations, surgeries, and ER visits in previous 12 months J.  [place] such as hearing and vision if needed, cognitive and depression L. Referrals and appointments   In addition, I have  reviewed and discussed with patient certain preventive protocols, quality metrics, and best practice recommendations. A written personalized care plan for preventive services as well as general preventive health recommendations were provided to patient.   Signed,    Bevelyn Ngo, LPN   [id number]  Nurse Health Advisor    Nurse Notes: none

## 2018-12-19 NOTE — Patient Instructions (Signed)
Connor Cruz , Thank you for taking time to come for your Medicare Wellness Visit. I appreciate your ongoing commitment to your health goals. Please review the following plan we discussed and let me know if I can assist you in the future.   Screening recommendations/referrals: Colonoscopy: complete 12/23/2013 Recommended yearly ophthalmology/optometry visit for glaucoma screening and checkup Recommended yearly dental visit for hygiene and checkup  Vaccinations: Influenza vaccine: declined Pneumococcal vaccine: declined Tdap vaccine: up to date Shingles vaccine: shingrix eligible    Advanced directives: Please bring a copy of your health care power of attorney and living will to the office at your convenience.  Conditions/risks identified: none   Next appointment: Follow up in one year for your annual wellness visit.   Preventive Care 67 Years and Older, Male Preventive care refers to lifestyle choices and visits with your health care provider that can promote health and wellness. What does preventive care include?  A yearly physical exam. This is also called an annual well check.  Dental exams once or twice a year.  Routine eye exams. Ask your health care provider how often you should have your eyes checked.  Personal lifestyle choices, including:  Daily care of your teeth and gums.  Regular physical activity.  Eating a healthy diet.  Avoiding tobacco and drug use.  Limiting alcohol use.  Practicing safe sex.  Taking low doses of aspirin every day.  Taking vitamin and mineral supplements as recommended by your health care provider. What happens during an annual well check? The services and screenings done by your health care provider during your annual well check will depend on your age, overall health, lifestyle risk factors, and family history of disease. Counseling  Your health care provider may ask you questions about your:  Alcohol use.  Tobacco use.  Drug  use.  Emotional well-being.  Home and relationship well-being.  Sexual activity.  Eating habits.  History of falls.  Memory and ability to understand (cognition).  Work and work Statistician. Screening  You may have the following tests or measurements:  Height, weight, and BMI.  Blood pressure.  Lipid and cholesterol levels. These may be checked every 5 years, or more frequently if you are over 12 years old.  Skin check.  Lung cancer screening. You may have this screening every year starting at age 22 if you have a 30-pack-year history of smoking and currently smoke or have quit within the past 15 years.  Fecal occult blood test (FOBT) of the stool. You may have this test every year starting at age 17.  Flexible sigmoidoscopy or colonoscopy. You may have a sigmoidoscopy every 5 years or a colonoscopy every 10 years starting at age 23.  Prostate cancer screening. Recommendations will vary depending on your family history and other risks.  Hepatitis C blood test.  Hepatitis B blood test.  Sexually transmitted disease (STD) testing.  Diabetes screening. This is done by checking your blood sugar (glucose) after you have not eaten for a while (fasting). You may have this done every 1-3 years.  Abdominal aortic aneurysm (AAA) screening. You may need this if you are a current or former smoker.  Osteoporosis. You may be screened starting at age 43 if you are at high risk. Talk with your health care provider about your test results, treatment options, and if necessary, the need for more tests. Vaccines  Your health care provider may recommend certain vaccines, such as:  Influenza vaccine. This is recommended every year.  Tetanus,  diphtheria, and acellular pertussis (Tdap, Td) vaccine. You may need a Td booster every 10 years.  Zoster vaccine. You may need this after age 72.  Pneumococcal 13-valent conjugate (PCV13) vaccine. One dose is recommended after age 58.   Pneumococcal polysaccharide (PPSV23) vaccine. One dose is recommended after age 25. Talk to your health care provider about which screenings and vaccines you need and how often you need them. This information is not intended to replace advice given to you by your health care provider. Make sure you discuss any questions you have with your health care provider. Document Released: 04/19/2015 Document Revised: 12/11/2015 Document Reviewed: 01/22/2015 Elsevier Interactive Patient Education  2017 [employer] Prevention in the Home Falls can cause injuries. They can happen to people of all ages. There are many things you can do to make your home safe and to help prevent falls. What can I do on the outside of my home?  Regularly fix the edges of walkways and driveways and fix any cracks.  Remove anything that might make you trip as you walk through a door, such as a raised step or threshold.  Trim any bushes or trees on the path to your home.  Use bright outdoor lighting.  Clear any walking paths of anything that might make someone trip, such as rocks or tools.  Regularly check to see if handrails are loose or broken. Make sure that both sides of any steps have handrails.  Any raised decks and porches should have guardrails on the edges.  Have any leaves, snow, or ice cleared regularly.  Use sand or salt on walking paths during winter.  Clean up any spills in your garage right away. This includes oil or grease spills. What can I do in the bathroom?  Use night lights.  Install grab bars by the toilet and in the tub and shower. Do not use towel bars as grab bars.  Use non-skid mats or decals in the tub or shower.  If you need to sit down in the shower, use a plastic, non-slip stool.  Keep the floor dry. Clean up any water that spills on the floor as soon as it happens.  Remove soap buildup in the tub or shower regularly.  Attach bath mats securely with double-sided non-slip  rug tape.  Do not have throw rugs and other things on the floor that can make you trip. What can I do in the bedroom?  Use night lights.  Make sure that you have a light by your bed that is easy to reach.  Do not use any sheets or blankets that are too big for your bed. They should not hang down onto the floor.  Have a firm chair that has side arms. You can use this for support while you get dressed.  Do not have throw rugs and other things on the floor that can make you trip. What can I do in the kitchen?  Clean up any spills right away.  Avoid walking on wet floors.  Keep items that you use a lot in easy-to-reach places.  If you need to reach something above you, use a strong step stool that has a grab bar.  Keep electrical cords out of the way.  Do not use floor polish or wax that makes floors slippery. If you must use wax, use non-skid floor wax.  Do not have throw rugs and other things on the floor that can make you trip. What can I do with  my stairs?  Do not leave any items on the stairs.  Make sure that there are handrails on both sides of the stairs and use them. Fix handrails that are broken or loose. Make sure that handrails are as long as the stairways.  Check any carpeting to make sure that it is firmly attached to the stairs. Fix any carpet that is loose or worn.  Avoid having throw rugs at the top or bottom of the stairs. If you do have throw rugs, attach them to the floor with carpet tape.  Make sure that you have a light switch at the top of the stairs and the bottom of the stairs. If you do not have them, ask someone to add them for you. What else can I do to help prevent falls?  Wear shoes that:  Do not have high heels.  Have rubber bottoms.  Are comfortable and fit you well.  Are closed at the toe. Do not wear sandals.  If you use a stepladder:  Make sure that it is fully opened. Do not climb a closed stepladder.  Make sure that both sides of  the stepladder are locked into place.  Ask someone to hold it for you, if possible.  Clearly mark and make sure that you can see:  Any grab bars or handrails.  First and last steps.  Where the edge of each step is.  Use tools that help you move around (mobility aids) if they are needed. These include:  Canes.  Walkers.  Scooters.  Crutches.  Turn on the lights when you go into a dark area. Replace any light bulbs as soon as they burn out.  Set up your furniture so you have a clear path. Avoid moving your furniture around.  If any of your floors are uneven, fix them.  If there are any pets around you, be aware of where they are.  Review your medicines with your doctor. Some medicines can make you feel dizzy. This can increase your chance of falling. Ask your doctor what other things that you can do to help prevent falls. This information is not intended to replace advice given to you by your health care provider. Make sure you discuss any questions you have with your health care provider. Document Released: 01/17/2009 Document Revised: 08/29/2015 Document Reviewed: 04/27/2014 Elsevier Interactive Patient Education  2017 [employer].

## 2019-02-27 DIAGNOSIS — L539 Erythematous condition, unspecified: Secondary | ICD-10-CM | POA: Diagnosis not present

## 2019-12-28 ENCOUNTER — Other Ambulatory Visit: Payer: Self-pay | Admitting: Family Medicine

## 2019-12-28 DIAGNOSIS — I10 Essential (primary) hypertension: Secondary | ICD-10-CM

## 2019-12-28 NOTE — Telephone Encounter (Signed)
Pt called and is requesting to have losartan and amlodipine refilled. Pt states that he has not been able to find a new doctor and is requesting to have a refill until he can. Please advise.     [facility] - [place], [place] - [address]  [place] [address]  Phone: [phone] Fax: [phone]  Hours: Not open 24 hours

## 2019-12-29 MED ORDER — LOSARTAN POTASSIUM 50 MG PO TABS: 50.0000 mg | ORAL_TABLET | Freq: Every day | ORAL | 0 refills | Status: AC

## 2019-12-29 MED ORDER — AMLODIPINE BESYLATE 10 MG PO TABS: 10.0000 mg | ORAL_TABLET | Freq: Every day | ORAL | 0 refills | Status: AC

## 2020-04-14 IMAGING — CR DG WRIST COMPLETE 3+V*R*
4 series · 4 of 4 positions shown · non-contrast
Comparison: None.

CLINICAL DATA: Pain to RIGHT wrist.  No injury.

EXAM:
RIGHT WRIST - COMPLETE 3+ VIEW

[wrist pa]
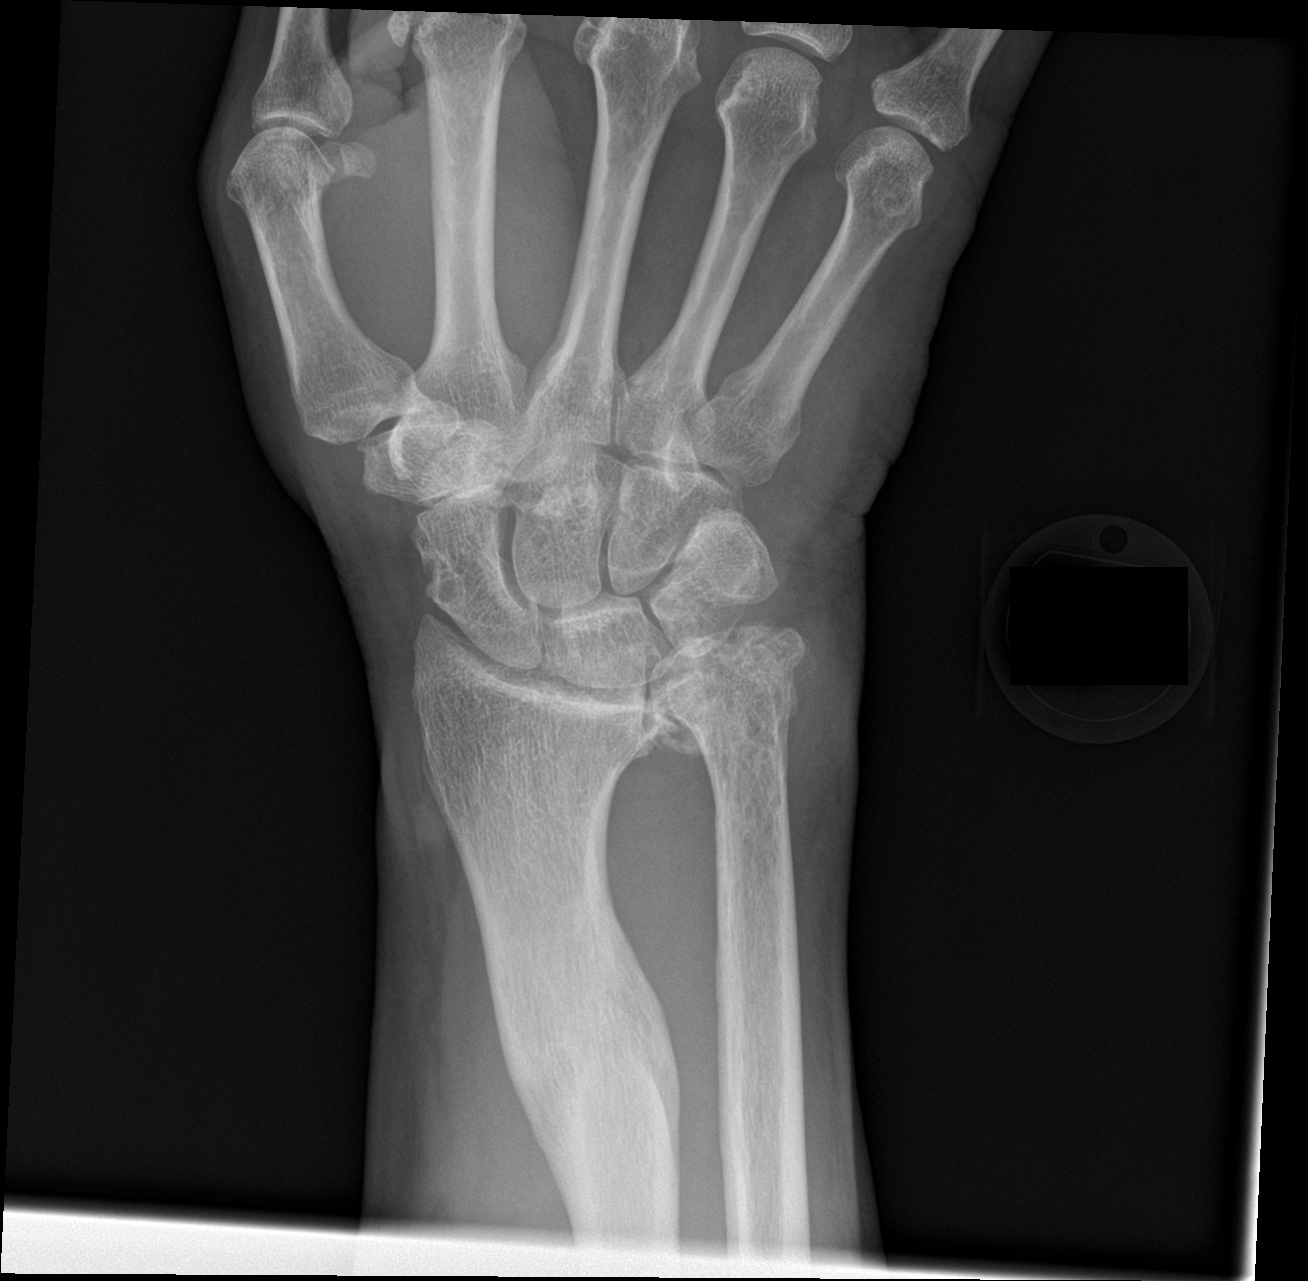

[wrist obl]
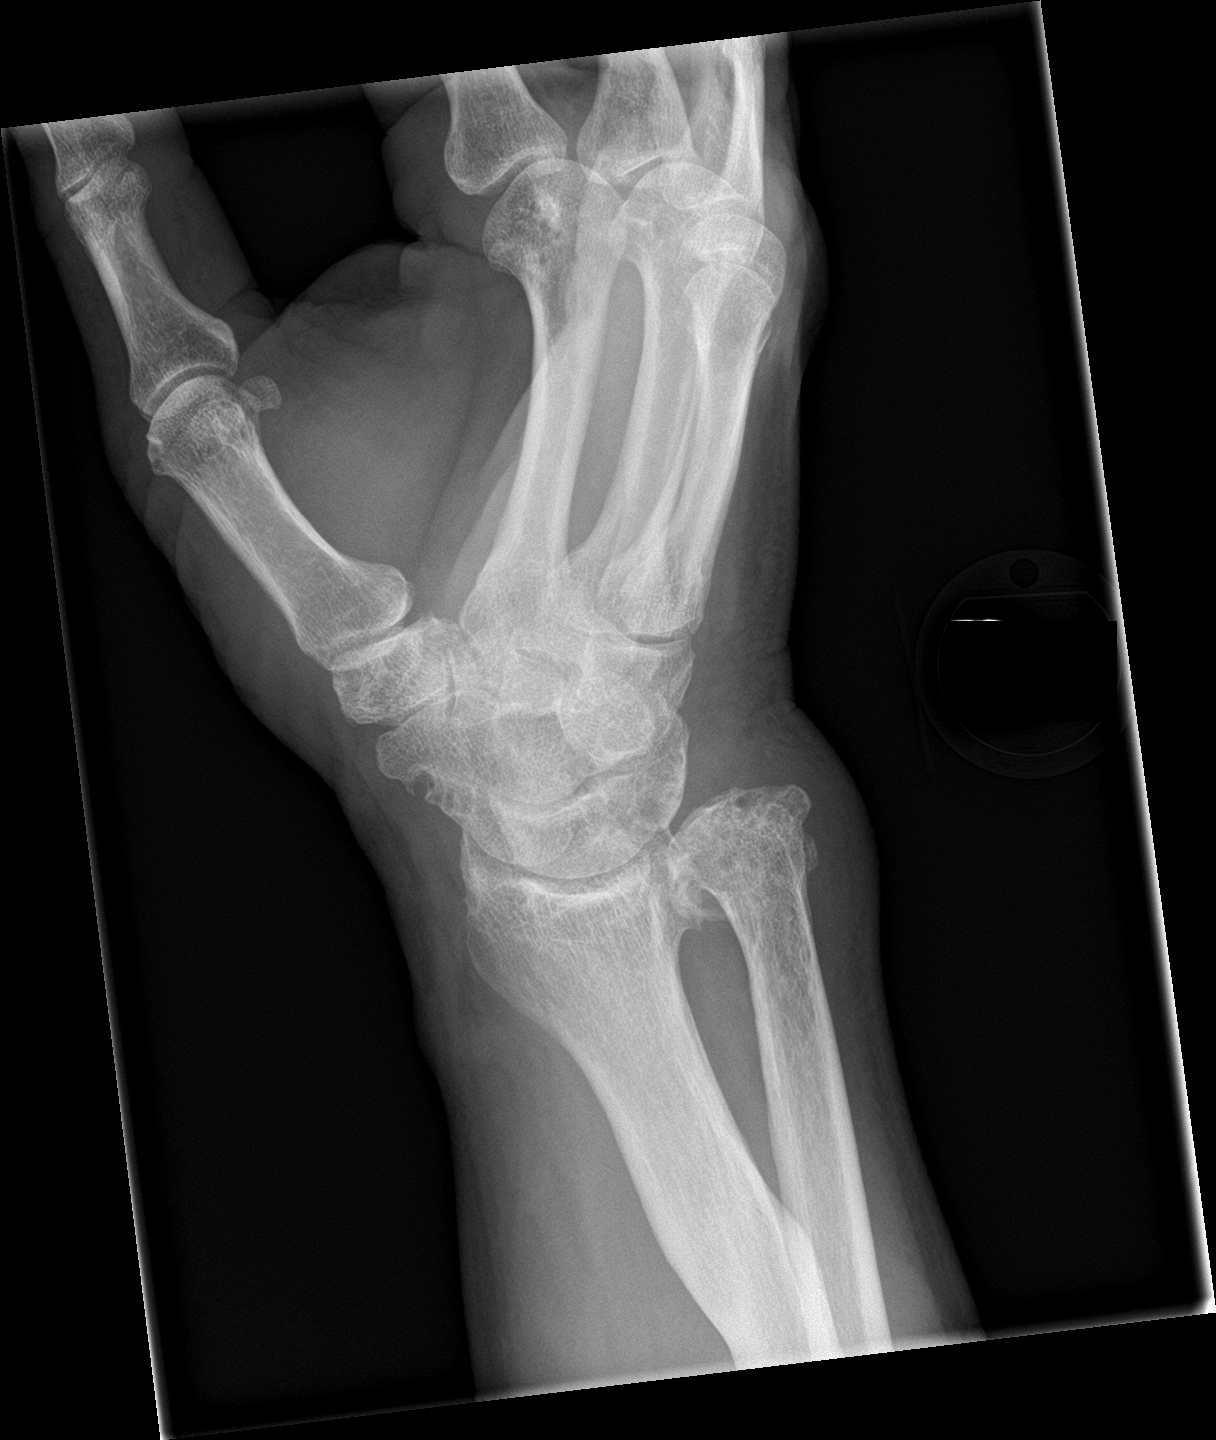

[wrist lat]
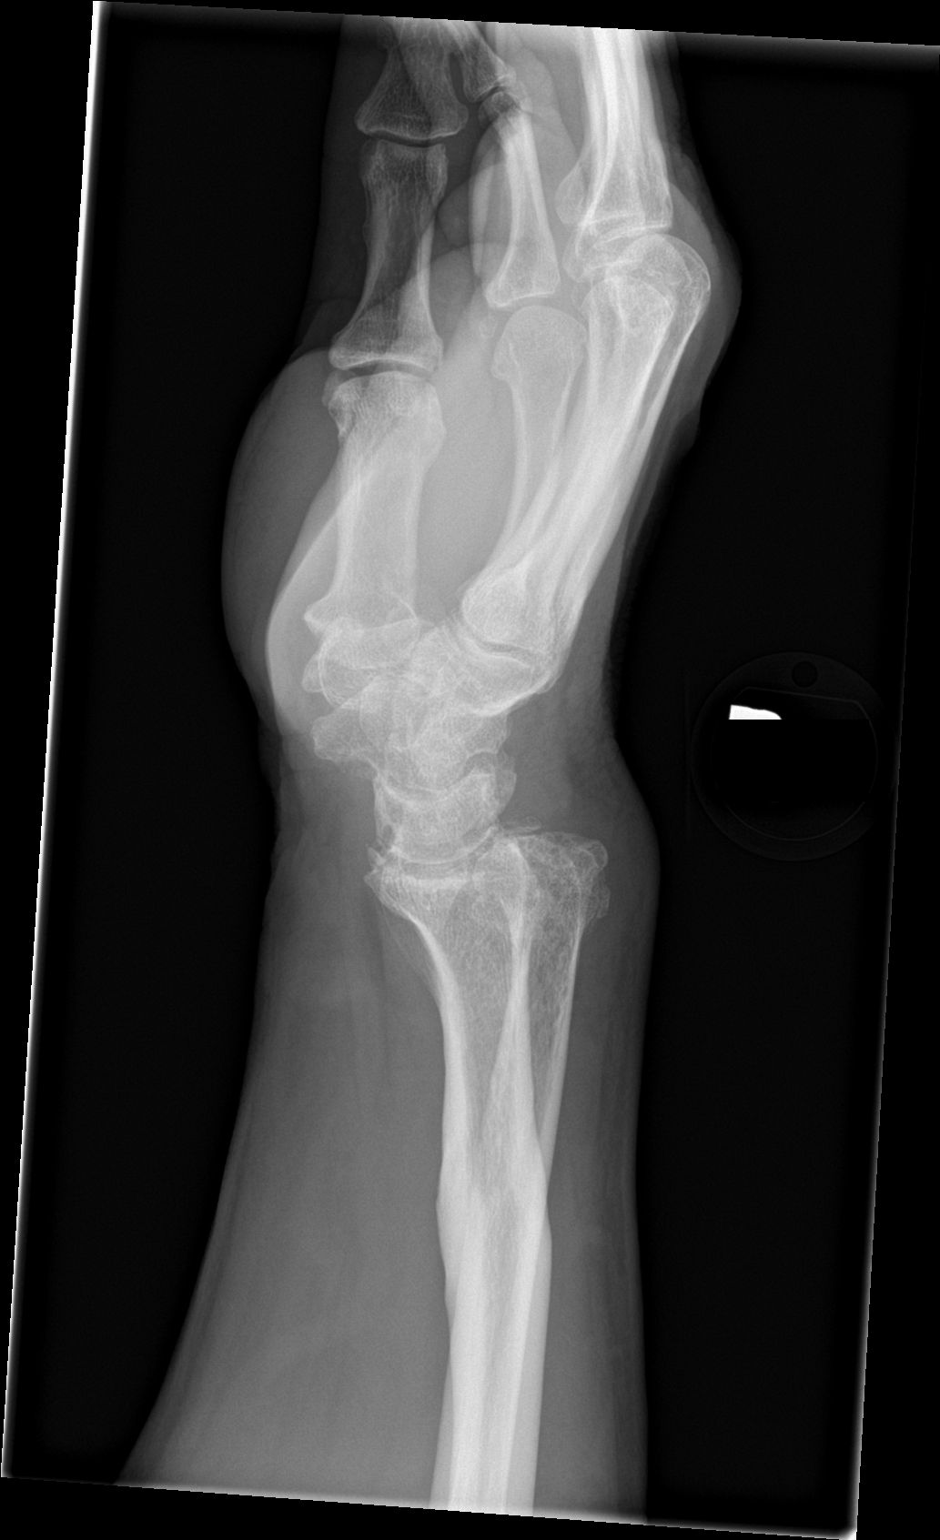

[navicular]
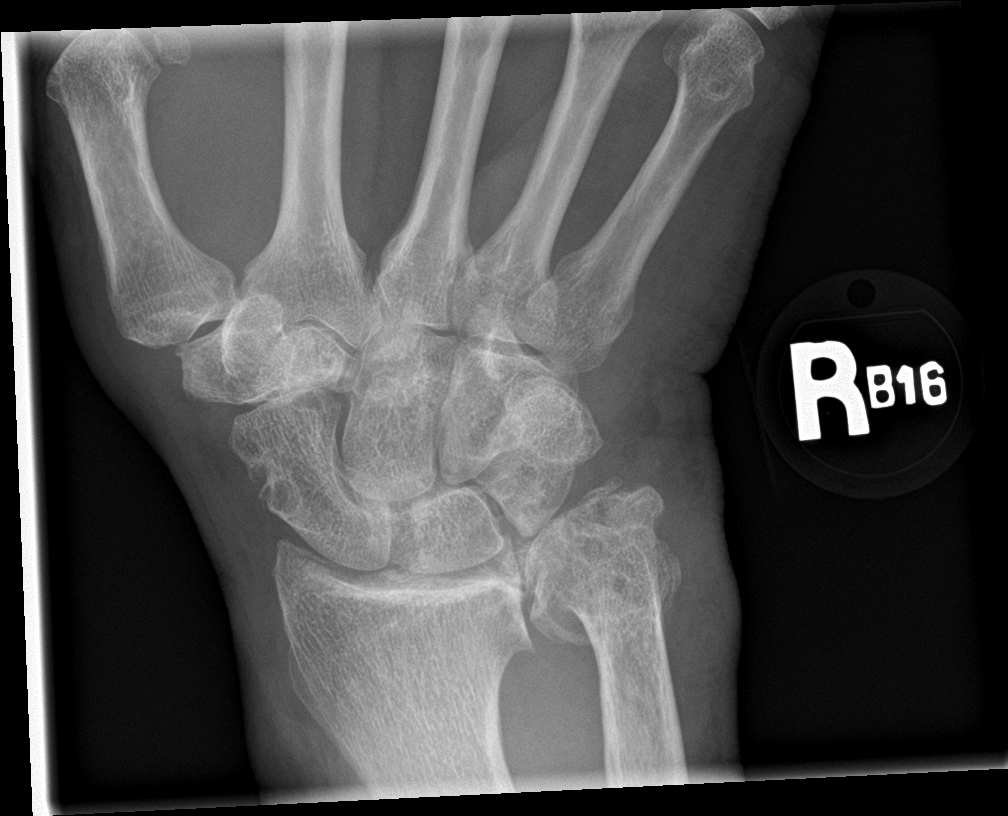

[4 of 4 positions shown; findings below may reference images not displayed]

FINDINGS: There is a chronic healed fracture of the distal radius. There is
joint space narrowing the radiocarpal and ulnocarpal joints. Marked
degenerative change with bony overgrowth, possible healed fracture,
is seen of the distal ulna. Subchondral cyst can be seen in the
navicular. No acute fracture is evident. There is soft tissue
swelling.
IMPRESSION: Chronic degenerative change as described. Old distal radius
fracture. Soft tissue swelling.

## 2020-05-20 DIAGNOSIS — I10 Essential (primary) hypertension: Secondary | ICD-10-CM | POA: Diagnosis not present

## 2020-05-27 DIAGNOSIS — I10 Essential (primary) hypertension: Secondary | ICD-10-CM | POA: Diagnosis not present

## 2020-05-27 DIAGNOSIS — Z0000 Encounter for general adult medical examination without abnormal findings: Secondary | ICD-10-CM | POA: Diagnosis not present

## 2020-05-27 DIAGNOSIS — K5909 Other constipation: Secondary | ICD-10-CM | POA: Diagnosis not present

## 2020-11-21 ENCOUNTER — Other Ambulatory Visit: Payer: Self-pay

## 2020-11-21 ENCOUNTER — Ambulatory Visit: Payer: Medicare HMO | Admitting: Dermatology

## 2020-11-21 DIAGNOSIS — D1800 Hemangioma unspecified site: Secondary | ICD-10-CM

## 2020-11-21 DIAGNOSIS — L578 Other skin changes due to chronic exposure to nonionizing radiation: Secondary | ICD-10-CM

## 2020-11-21 DIAGNOSIS — Z1283 Encounter for screening for malignant neoplasm of skin: Secondary | ICD-10-CM | POA: Diagnosis not present

## 2020-11-21 DIAGNOSIS — D229 Melanocytic nevi, unspecified: Secondary | ICD-10-CM | POA: Diagnosis not present

## 2020-11-21 DIAGNOSIS — L821 Other seborrheic keratosis: Secondary | ICD-10-CM

## 2020-11-21 DIAGNOSIS — L814 Other melanin hyperpigmentation: Secondary | ICD-10-CM | POA: Diagnosis not present

## 2020-11-21 DIAGNOSIS — L570 Actinic keratosis: Secondary | ICD-10-CM | POA: Diagnosis not present

## 2020-11-21 NOTE — Patient Instructions (Addendum)
Actinic keratoses are precancerous spots that appear secondary to cumulative UV radiation exposure/sun exposure over time. They are chronic with expected duration over 1 year. A portion of actinic keratoses will progress to squamous cell carcinoma of the skin. It is not possible to reliably predict which spots will progress to skin cancer and so treatment is recommended to prevent development of skin cancer.  Recommend daily broad spectrum sunscreen SPF 30+ to sun-exposed areas, reapply every 2 hours as needed.  Recommend staying in the shade or wearing long sleeves, sun glasses (UVA+UVB protection) and wide brim hats (4-inch brim around the entire circumference of the hat). Call for new or changing lesions.   Cryotherapy Aftercare  Wash gently with soap and water everyday.   Apply Vaseline and Band-Aid daily until healed.    Melanoma ABCDEs  Melanoma is the most dangerous type of skin cancer, and is the leading cause of death from skin disease.  You are more likely to develop melanoma if you: Have light-colored skin, light-colored eyes, or red or blond hair Spend a lot of time in the sun Tan regularly, either outdoors or in a tanning bed Have had blistering sunburns, especially during childhood Have a close family member who has had a melanoma Have atypical moles or large birthmarks  Early detection of melanoma is key since treatment is typically straightforward and cure rates are extremely high if we catch it early.   The first sign of melanoma is often a change in a mole or a new dark spot.  The ABCDE system is a way of remembering the signs of melanoma.  A for asymmetry:  The two halves do not match. B for border:  The edges of the growth are irregular. C for color:  A mixture of colors are present instead of an even brown color. D for diameter:  Melanomas are usually (but not always) greater than 74mm - the size of a pencil eraser. E for evolution:  The spot keeps changing in size,  shape, and color.  Please check your skin once per month between visits. You can use a small mirror in front and a large mirror behind you to keep an eye on the back side or your body.   If you see any new or changing lesions before your next follow-up, please call to schedule a visit.  Please continue daily skin protection including broad spectrum sunscreen SPF 30+ to sun-exposed areas, reapplying every 2 hours as needed when you're outdoors.   Staying in the shade or wearing long sleeves, sun glasses (UVA+UVB protection) and wide brim hats (4-inch brim around the entire circumference of the hat) are also recommended for sun protection.    If you have any questions or concerns for your doctor, please call our main line at [phone] and press option 4 to reach your doctor's medical assistant. If no one answers, please leave a voicemail as directed and we will return your call as soon as possible. Messages left after 4 pm will be answered the following business day.   You may also send [place] a message via [place]. We typically respond to MyChart messages within 1-2 business days.  For prescription refills, please ask your pharmacy to contact our office. Our fax number is [phone].  If you have an urgent issue when the clinic is closed that cannot wait until the next business day, you can page your doctor at the number below.    Please note that while we do our best to  be available for urgent issues outside of office hours, we are not available 24/7.   If you have an urgent issue and are unable to reach [place], you may choose to seek medical care at your doctor's office, retail clinic, urgent care center, or emergency room.  If you have a medical emergency, please immediately call 911 or go to the emergency department.  Pager Numbers  - Dr. Nehemiah Massed: [phone]  - Dr. Laurence Ferrari: [phone]  - Dr. Nicole Kindred: [phone]  In the event of inclement weather, please call our main line at  [phone] for an update on the status of any delays or closures.  Dermatology Medication Tips: Please keep the boxes that topical medications come in in order to help keep track of the instructions about where and how to use these. Pharmacies typically print the medication instructions only on the boxes and not directly on the medication tubes.   If your medication is too expensive, please contact our office at [phone] option 4 or send [place] a message through [place].   We are unable to tell what your co-pay for medications will be in advance as this is different depending on your insurance coverage. However, we may be able to find a substitute medication at lower cost or fill out paperwork to get insurance to cover a needed medication.   If a prior authorization is required to get your medication covered by your insurance company, please allow [place] 1-2 business days to complete this process.  Drug prices often vary depending on where the prescription is filled and some pharmacies may offer cheaper prices.  The website www.goodrx.com contains coupons for medications through different pharmacies. The prices here do not account for what the cost may be with help from insurance (it may be cheaper with your insurance), but the website can give you the price if you did not use any insurance.  - You can print the associated coupon and take it with your prescription to the pharmacy.  - You may also stop by our office during regular business hours and pick up a GoodRx coupon card.  - If you need your prescription sent electronically to a different pharmacy, notify our office through [facility] or by phone at [phone] option 4.

## 2020-11-21 NOTE — Progress Notes (Deleted)
Follow-Up Visit   Subjective  Connor Cruz is a 70 y.o. male who presents for the following: Follow-up (Patient states he was here today for concerns with spot at head. He states is now resolved. He would like to have other areas checked today. Patient reports no history of skin cancer or family of skin cancer. ).  Patient here for full body skin exam and skin cancer screening.  The following portions of the chart were reviewed this encounter and updated as appropriate:  Tobacco  Allergies  Meds  Problems  Med Hx  Surg Hx  Fam Hx      Objective  Well appearing patient in no apparent distress; mood and affect are within normal limits.  A full examination was performed including scalp, head, eyes, ears, nose, lips, neck, chest, axillae, abdomen, back, buttocks, bilateral upper extremities, bilateral lower extremities, hands, feet, fingers, toes, fingernails, and toenails. All findings within normal limits unless otherwise noted below.  Scalp x 15 (15) Erythematous thin papules/macules with gritty scale.   Assessment & Plan  Actinic keratosis (15) Scalp x 15  Actinic keratoses are precancerous spots that appear secondary to cumulative UV radiation exposure/sun exposure over time. They are chronic with expected duration over 1 year. A portion of actinic keratoses will progress to squamous cell carcinoma of the skin. It is not possible to reliably predict which spots will progress to skin cancer and so treatment is recommended to prevent development of skin cancer.  Recommend daily broad spectrum sunscreen SPF 30+ to sun-exposed areas, reapply every 2 hours as needed.  Recommend staying in the shade or wearing long sleeves, sun glasses (UVA+UVB protection) and wide brim hats (4-inch brim around the entire circumference of the hat). Call for new or changing lesions.  Destruction of lesion - Scalp x 15 Complexity: simple   Destruction method: cryotherapy   Informed  consent: discussed and consent obtained   Timeout:  patient name, date of birth, surgical site, and procedure verified Lesion destroyed using liquid nitrogen: Yes   Region frozen until ice ball extended beyond lesion: Yes   Outcome: patient tolerated procedure well with no complications   Post-procedure details: wound care instructions given    Skin cancer screening  Lentigines - Scattered tan macules - Due to sun exposure - Benign-appering, observe - Recommend daily broad spectrum sunscreen SPF 30+ to sun-exposed areas, reapply every 2 hours as needed. - Call for any changes  Seborrheic Keratoses - Stuck-on, waxy, tan-brown papules and/or plaques  - Benign-appearing - Discussed benign etiology and prognosis. - Observe - Call for any changes  Melanocytic Nevi - Tan-brown and/or pink-flesh-colored symmetric macules and papules - Benign appearing on exam today - Observation - Call clinic for new or changing moles - Recommend daily use of broad spectrum spf 30+ sunscreen to sun-exposed areas.   Hemangiomas - Red papules - Discussed benign nature - Observe - Call for any changes  Skin cancer screening performed today.  Actinic Damage - Severe, confluent actinic changes with pre-cancerous actinic keratoses  - Severe, chronic, not at goal, secondary to cumulative UV radiation exposure over time - diffuse scaly erythematous macules and papules with underlying dyspigmentation - Discussed Prescription "Field Treatment" for Severe, Chronic Confluent Actinic Changes with Pre-Cancerous Actinic Keratoses Field treatment involves treatment of an entire area of skin that has confluent Actinic Changes (Sun/ Ultraviolet light damage) and PreCancerous Actinic Keratoses by method of PhotoDynamic Therapy (PDT) and/or prescription Topical Chemotherapy agents such as 5-fluorouracil, 5-fluorouracil/calcipotriene, and/or imiquimod.  The purpose is to decrease the number of clinically evident and  subclinical PreCancerous lesions to prevent progression to development of skin cancer by chemically destroying early precancer changes that may or may not be visible.  It has been shown to reduce the risk of developing skin cancer in the treated area. As a result of treatment, redness, scaling, crusting, and open sores may occur during treatment course. One or more than one of these methods may be used and may have to be used several times to control, suppress and eliminate the PreCancerous changes. Discussed treatment course, expected reaction, and possible side effects. - Recommend daily broad spectrum sunscreen SPF 30+ to sun-exposed areas, reapply every 2 hours as needed.  - Staying in the shade or wearing long sleeves, sun glasses (UVA+UVB protection) and wide brim hats (4-inch brim around the entire circumference of the hat) are also recommended. - Call for new or changing lesions. - Will schedule photodynamic therapy to the scalp in October.   Return for schedule pdt in october, ak follow up in January after PDT treatment, 1 year tbse .  IRuthell Rummage, CMA, am acting as scribe for Sarina Ser, MD. Documentation: I have reviewed the above documentation for accuracy and completeness, and I agree with the above.  Sarina Ser, MD

## 2020-11-24 ENCOUNTER — Encounter: Payer: Self-pay | Admitting: Dermatology

## 2020-11-25 DIAGNOSIS — I10 Essential (primary) hypertension: Secondary | ICD-10-CM | POA: Diagnosis not present

## 2020-11-27 NOTE — Progress Notes (Addendum)
Follow-Up Visit   Subjective  Connor Cruz is a 70 y.o. male who presents for the following: Follow-up (Patient states he was here today for concerns with spot at head. He states is now resolved. He would like to have other areas checked today. Patient reports no history of skin cancer or family of skin cancer. ). Patient here for full body skin exam and skin cancer screening.  The following portions of the chart were reviewed this encounter and updated as appropriate:  Tobacco  Allergies  Meds  Problems  Med Hx  Surg Hx  Fam Hx     Objective  Well appearing patient in no apparent distress; mood and affect are within normal limits.  A full examination was performed including scalp, head, eyes, ears, nose, lips, neck, chest, axillae, abdomen, back, buttocks, bilateral upper extremities, bilateral lower extremities, hands, feet, fingers, toes, fingernails, and toenails. All findings within normal limits unless otherwise noted below.  Scalp x 15 (15) Erythematous thin papules/macules with gritty scale.   Assessment & Plan  Actinic keratosis (15) Scalp x 15  Actinic Damage - Severe, confluent actinic changes with pre-cancerous actinic keratoses  - Severe, chronic, not at goal, secondary to cumulative UV radiation exposure over time - diffuse scaly erythematous macules and papules with underlying dyspigmentation - Discussed Prescription "Field Treatment" for Severe, Chronic Confluent Actinic Changes with Pre-Cancerous Actinic Keratoses Field treatment involves treatment of an entire area of skin that has confluent Actinic Changes (Sun/ Ultraviolet light damage) and PreCancerous Actinic Keratoses by method of PhotoDynamic Therapy (PDT) and/or prescription Topical Chemotherapy agents such as 5-fluorouracil, 5-fluorouracil/calcipotriene, and/or imiquimod.  The purpose is to decrease the number of clinically evident and subclinical PreCancerous lesions to prevent progression to  development of skin cancer by chemically destroying early precancer changes that may or may not be visible.  It has been shown to reduce the risk of developing skin cancer in the treated area. As a result of treatment, redness, scaling, crusting, and open sores may occur during treatment course. One or more than one of these methods may be used and may have to be used several times to control, suppress and eliminate the PreCancerous changes. Discussed treatment course, expected reaction, and possible side effects. - Recommend daily broad spectrum sunscreen SPF 30+ to sun-exposed areas, reapply every 2 hours as needed.  - Staying in the shade or wearing long sleeves, sun glasses (UVA+UVB protection) and wide brim hats (4-inch brim around the entire circumference of the hat) are also recommended. - Call for new or changing lesions.  Will plan PDT to scalp in October 2022.  Destruction of lesion - Scalp x 15 Complexity: simple   Destruction method: cryotherapy   Informed consent: discussed and consent obtained   Timeout:  patient name, date of birth, surgical site, and procedure verified Lesion destroyed using liquid nitrogen: Yes   Region frozen until ice ball extended beyond lesion: Yes   Outcome: patient tolerated procedure well with no complications   Post-procedure details: wound care instructions given    Skin cancer screening  Lentigines - Scattered tan macules - Due to sun exposure - Benign-appering, observe - Recommend daily broad spectrum sunscreen SPF 30+ to sun-exposed areas, reapply every 2 hours as needed. - Call for any changes  Seborrheic Keratoses - Stuck-on, waxy, tan-brown papules and/or plaques  - Benign-appearing - Discussed benign etiology and prognosis. - Observe - Call for any changes  Melanocytic Nevi - Tan-brown and/or pink-flesh-colored symmetric macules and papules -  Benign appearing on exam today - Observation - Call clinic for new or changing moles -  Recommend daily use of broad spectrum spf 30+ sunscreen to sun-exposed areas.   Hemangiomas - Red papules - Discussed benign nature - Observe - Call for any changes  Actinic Damage - Chronic condition, secondary to cumulative UV/sun exposure - diffuse scaly erythematous macules with underlying dyspigmentation - Recommend daily broad spectrum sunscreen SPF 30+ to sun-exposed areas, reapply every 2 hours as needed.  - Staying in the shade or wearing long sleeves, sun glasses (UVA+UVB protection) and wide brim hats (4-inch brim around the entire circumference of the hat) are also recommended for sun protection.  - Call for new or changing lesions.  Skin cancer screening performed today.  Return for schedule pdt in october, ak follow up in January after PDT treatment, 1 year tbse . IRuthell Rummage, CMA, am acting as scribe for Sarina Ser, MD. Documentation: I have reviewed the above documentation for accuracy and completeness, and I agree with the above.  Sarina Ser, MD

## 2020-12-02 DIAGNOSIS — K5909 Other constipation: Secondary | ICD-10-CM | POA: Diagnosis not present

## 2020-12-02 DIAGNOSIS — I10 Essential (primary) hypertension: Secondary | ICD-10-CM | POA: Diagnosis not present

## 2020-12-02 DIAGNOSIS — N401 Enlarged prostate with lower urinary tract symptoms: Secondary | ICD-10-CM | POA: Diagnosis not present

## 2020-12-02 DIAGNOSIS — R350 Frequency of micturition: Secondary | ICD-10-CM | POA: Diagnosis not present

## 2020-12-02 DIAGNOSIS — Z136 Encounter for screening for cardiovascular disorders: Secondary | ICD-10-CM | POA: Diagnosis not present

## 2020-12-02 DIAGNOSIS — Z125 Encounter for screening for malignant neoplasm of prostate: Secondary | ICD-10-CM | POA: Diagnosis not present

## 2020-12-14 NOTE — Addendum Note (Signed)
Addended by: Ralene Bathe on: 12/14/2020 05:55 PM   Modules accepted: Level of Service

## 2021-01-23 ENCOUNTER — Other Ambulatory Visit: Payer: Self-pay

## 2021-01-23 ENCOUNTER — Ambulatory Visit: Payer: Medicare HMO

## 2021-04-23 ENCOUNTER — Other Ambulatory Visit: Payer: Self-pay

## 2021-04-23 ENCOUNTER — Ambulatory Visit (INDEPENDENT_AMBULATORY_CARE_PROVIDER_SITE_OTHER): Payer: Medicare HMO | Admitting: Dermatology

## 2021-04-23 DIAGNOSIS — L570 Actinic keratosis: Secondary | ICD-10-CM

## 2021-04-23 DIAGNOSIS — L578 Other skin changes due to chronic exposure to nonionizing radiation: Secondary | ICD-10-CM

## 2021-04-23 MED ORDER — FLUOROURACIL 5 % EX CREA: TOPICAL_CREAM | CUTANEOUS | 0 refills | Status: DC

## 2021-04-23 NOTE — Patient Instructions (Signed)
Photodynamic Therapy/Blue Light Therapy  Actinic keratoses are the dry, red scaly spots on the skin caused by sun damage. A portion of these spots can turn into skin cancer with time, and treating them can help prevent development of skin cancer.   Treatment of these spots requires removal of the defective skin cells. There are various ways to remove actinic keratoses, including freezing with liquid nitrogen, treatment with creams, or treatment with a blue light procedure in the office.   Photodynamic Therapy (PDT), also known as "blue light therapy" is an in office procedure used to treat actinic keratoses. It works by targeting precancerous cells. After treatment, these cells peel off and are replaced by healthy ones.   For your phototherapy appointment, you will have two appointments on the day of your treatment. The first appointment will be to apply a cream to the treatment area. You will leave this cream on for 1-2 hours depending on the area being treated. The second appointment will be to shine a blue light on the area for 16 minutes to kill off the precancer cells. It is common to experience a burning sensation during the treatment.  After your treatment, it will be important to keep the treated areas of skin out of the sun completely for 48-72 hours (2-3 days) to prevent having a reaction.   Common side effects include: - Burning or stinging, which may be severe and can last up to 24-72 hours after your treatment - Scaling and crusting which may last up to 2 weeks - Redness, swelling and/or peeling which can last up to 4 weeks  To Care for Your Skin After PDT/Blue Light Therapy: - Wash with soap, water and shampoo as normal. - If needed, you can use cold compresses (e.g. ice packs) for comfort - If okay with your primary care doctor, you may use analgesics such as acetaminophen (tylenol) every 4-6 hours, not to exceed recommended dose - You may apply Cerave Healing Ointment, Vaseline  or Aquaphor as needed - If you have a lot of swelling you may take a Benadryl to help with this (this may cause drowsiness), not to exceed recommended dose. This may increase the risk of falls in people over 65 and may slow reaction time while driving, so it is not recommended to take before driving or operating machinery. - Sun Precautions - Wear a wide brim hat for the next week if outside  - Wear a sunblock with zinc or titanium dioxide at least SPF 50 daily  If you have any questions or concerns, please call the office and ask to speak with a nurse.   --------------------------------------------------------------------------------------------------------------  Start 5-fluorouracil/calcipotriene cream twice a day for 7 days to affected areas including scalp. Prescription sent to [facility]. Patient provided with contact information for pharmacy and advised the pharmacy will mail the prescription to their home. Patient provided with handout reviewing treatment course and side effects and advised to call or message [place] on MyChart with any concerns.   5-fluorouracil/calcipotriene cream is is a type of field treatment used to treat precancers, thin skin cancers, and areas of sun damage. Reviewed expected reaction including irritation and mild inflammation potentially progressing to more severe inflammation including redness, scaling, crusting and open sores/erosions.  Reviewed if too much irritation occurs, ensure application of only a thin layer and decrease frequency of use to achieve a tolerable level of inflammation. Recommend applying Vaseline ointment to open sores as needed.  Minimize sun exposure while under treatment. Recommend daily  broad spectrum sunscreen SPF 30+ to sun-exposed areas, reapply every 2 hours as needed.     If You Need Anything After Your Visit  If you have any questions or concerns for your doctor, please call our main line at [phone] and press option 4 to reach  your doctor's medical assistant. If no one answers, please leave a voicemail as directed and we will return your call as soon as possible. Messages left after 4 pm will be answered the following business day.   You may also send [place] a message via [place]. We typically respond to MyChart messages within 1-2 business days.  For prescription refills, please ask your pharmacy to contact our office. Our fax number is [phone].  If you have an urgent issue when the clinic is closed that cannot wait until the next business day, you can page your doctor at the number below.    Please note that while we do our best to be available for urgent issues outside of office hours, we are not available 24/7.   If you have an urgent issue and are unable to reach [place], you may choose to seek medical care at your doctor's office, retail clinic, urgent care center, or emergency room.  If you have a medical emergency, please immediately call 911 or go to the emergency department.  Pager Numbers  - Dr. Nehemiah Massed: [phone]  - Dr. Laurence Ferrari: [phone]  - Dr. Nicole Kindred: [phone]  In the event of inclement weather, please call our main line at [phone] for an update on the status of any delays or closures.  Dermatology Medication Tips: Please keep the boxes that topical medications come in in order to help keep track of the instructions about where and how to use these. Pharmacies typically print the medication instructions only on the boxes and not directly on the medication tubes.   If your medication is too expensive, please contact our office at [phone] option 4 or send [place] a message through [place].   We are unable to tell what your co-pay for medications will be in advance as this is different depending on your insurance coverage. However, we may be able to find a substitute medication at lower cost or fill out paperwork to get insurance to cover a needed medication.   If a prior authorization  is required to get your medication covered by your insurance company, please allow [place] 1-2 business days to complete this process.  Drug prices often vary depending on where the prescription is filled and some pharmacies may offer cheaper prices.  The website www.goodrx.com contains coupons for medications through different pharmacies. The prices here do not account for what the cost may be with help from insurance (it may be cheaper with your insurance), but the website can give you the price if you did not use any insurance.  - You can print the associated coupon and take it with your prescription to the pharmacy.  - You may also stop by our office during regular business hours and pick up a GoodRx coupon card.  - If you need your prescription sent electronically to a different pharmacy, notify our office through [facility] or by phone at [phone] option 4.     Si Usted Necesita Algo Despus de Su Visita  Tambin puede enviarnos un mensaje a travs de Pharmacist, community. Por lo general respondemos a los mensajes de MyChart en el transcurso de 1 a 2 das hbiles.  Para renovar recetas, por favor pida a su  farmacia que se ponga en contacto con nuestra oficina. Harland Dingwall de fax es [place] [phone].  Si tiene un asunto urgente cuando la clnica est cerrada y que no puede esperar hasta el siguiente da hbil, puede llamar/localizar a su doctor(a) al nmero que aparece a continuacin.   Por favor, tenga en cuenta que aunque hacemos todo lo posible para estar disponibles para asuntos urgentes fuera del horario de [place], no estamos disponibles las 24 horas del da, los 7 das de la [place].   Si tiene un problema urgente y no puede comunicarse con nosotros, puede optar por buscar atencin mdica  en el consultorio de su doctor(a), en una clnica privada, en un centro de atencin urgente o en una sala de emergencias.  Si tiene Engineering geologist, por favor llame inmediatamente al 911 o  vaya a la sala de emergencias.  Nmeros de bper  - Dr. Nehemiah Massed: [phone]  - Dra. Moye: [phone]  - Dra. Nicole Kindred: [phone]  En caso de inclemencias del [place], por favor llame a Johnsie Kindred principal al [phone] para una actualizacin sobre el [place] de cualquier retraso o cierre.  Consejos para la medicacin en dermatologa: Por favor, guarde las cajas en las que vienen los medicamentos de uso tpico para ayudarle a seguir las instrucciones sobre dnde y cmo usarlos. Las farmacias generalmente imprimen las instrucciones del medicamento slo en las cajas y no directamente en los tubos del [place].   Si su medicamento es muy caro, por favor, pngase en contacto con Zigmund Daniel llamando al [phone] y presione la opcin 4 o envenos un mensaje a travs de Pharmacist, community.   No podemos decirle cul ser su copago por los medicamentos por adelantado ya que esto es diferente dependiendo de la cobertura de su seguro. Sin embargo, es posible que podamos encontrar un medicamento sustituto a Electrical engineer un formulario para que el seguro cubra el medicamento que se considera necesario.   Si se requiere una autorizacin previa para que su compaa de seguros [place] su medicamento, por favor permtanos de 1 a 2 das hbiles para completar este proceso.  Los precios de los medicamentos varan con frecuencia dependiendo del Environmental consultant de dnde se surte la receta y alguna farmacias pueden ofrecer precios ms baratos.  El sitio web www.goodrx.com tiene cupones para medicamentos de Airline pilot. Los precios aqu no tienen en cuenta lo que podra costar con la ayuda del seguro (puede ser ms barato con su seguro), pero el sitio web puede darle el precio si no utiliz Research scientist (physical sciences).  - Puede imprimir el cupn correspondiente y llevarlo con su receta a la farmacia.  - Tambin puede pasar por nuestra oficina durante el horario de atencin regular y Charity fundraiser una tarjeta de cupones  de GoodRx.  - Si necesita que su receta se enve electrnicamente a una farmacia diferente, informe a nuestra oficina a travs de MyChart de [place] o por telfono llamando al [phone] y presione la opcin 4.

## 2021-04-23 NOTE — Progress Notes (Signed)
Follow-Up Visit   Subjective  Connor Cruz is a 71 y.o. male who presents for the following: Actinic Keratosis (Of the scalp - patient was unable to have PDT because he wasn't aware of the process and that it was going to take a few hours. He hasn't found a day that works with his schedule yet.). The patient has spots, moles and lesions to be evaluated, some may be new or changing and the patient has concerns that these could be cancer.  The following portions of the chart were reviewed this encounter and updated as appropriate:   Tobacco   Allergies   Meds   Problems   Med Hx   Surg Hx   Fam Hx       Review of Systems:  No other skin or systemic complaints except as noted in HPI or Assessment and Plan.  Objective  Well appearing patient in no apparent distress; mood and affect are within normal limits.  A focused examination was performed including the face and scalp. Relevant physical exam findings are noted in the Assessment and Plan.  Scalp x 18 (18) Erythematous thin papules/macules with gritty scale.    Assessment & Plan  AK (actinic keratosis) (18) Scalp x 18 Start 5FU/Calcipotriene cream BID x 7 days to the scalp. 5-fluorouracil/calcipotriene cream is is a type of field treatment used to treat precancers, thin skin cancers, and areas of sun damage. Reviewed expected reaction including irritation and mild inflammation potentially progressing to more severe inflammation including redness, scaling, crusting and open sores/erosions.  Reviewed if too much irritation occurs, ensure application of only a thin layer and decrease frequency of use to achieve a tolerable level of inflammation. Recommend applying Vaseline ointment to open sores as needed.  Minimize sun exposure while under treatment. Recommend daily broad spectrum sunscreen SPF 30+ to sun-exposed areas, reapply every 2 hours as needed.   Destruction of lesion - Scalp x 18 Complexity: simple   Destruction method:  cryotherapy   Informed consent: discussed and consent obtained   Timeout:  patient name, date of birth, surgical site, and procedure verified Lesion destroyed using liquid nitrogen: Yes   Region frozen until ice ball extended beyond lesion: Yes   Outcome: patient tolerated procedure well with no complications   Post-procedure details: wound care instructions given    fluorouracil (EFUDEX) 5 % cream - Scalp x 18 Apply to the scalp BID x 7 days.  Actinic Damage - Severe, confluent actinic changes with pre-cancerous actinic keratoses  - Severe, chronic, not at goal, secondary to cumulative UV radiation exposure over time - diffuse scaly erythematous macules and papules with underlying dyspigmentation - Discussed Prescription "Field Treatment" for Severe, Chronic Confluent Actinic Changes with Pre-Cancerous Actinic Keratoses Field treatment involves treatment of an entire area of skin that has confluent Actinic Changes (Sun/ Ultraviolet light damage) and PreCancerous Actinic Keratoses by method of PhotoDynamic Therapy (PDT) and/or prescription Topical Chemotherapy agents such as 5-fluorouracil, 5-fluorouracil/calcipotriene, and/or imiquimod.  The purpose is to decrease the number of clinically evident and subclinical PreCancerous lesions to prevent progression to development of skin cancer by chemically destroying early precancer changes that may or may not be visible.  It has been shown to reduce the risk of developing skin cancer in the treated area. As a result of treatment, redness, scaling, crusting, and open sores may occur during treatment course. One or more than one of these methods may be used and may have to be used several times to control,  suppress and eliminate the PreCancerous changes. Discussed treatment course, expected reaction, and possible side effects. - Recommend daily broad spectrum sunscreen SPF 30+ to sun-exposed areas, reapply every 2 hours as needed.  - Staying in the shade or  wearing long sleeves, sun glasses (UVA+UVB protection) and wide brim hats (4-inch brim around the entire circumference of the hat) are also recommended. - Call for new or changing lesions.  Return in about 3 months (around 07/22/2021) for AK f/u .  Luther Redo, CMA, am acting as scribe for Sarina Ser, MD . Documentation: I have reviewed the above documentation for accuracy and completeness, and I agree with the above.  Sarina Ser, MD

## 2021-04-24 ENCOUNTER — Encounter: Payer: Self-pay | Admitting: Dermatology

## 2021-07-21 ENCOUNTER — Ambulatory Visit: Payer: Medicare HMO | Admitting: Dermatology

## 2021-07-21 DIAGNOSIS — L578 Other skin changes due to chronic exposure to nonionizing radiation: Secondary | ICD-10-CM

## 2021-07-21 DIAGNOSIS — L570 Actinic keratosis: Secondary | ICD-10-CM

## 2021-07-21 DIAGNOSIS — L821 Other seborrheic keratosis: Secondary | ICD-10-CM

## 2021-07-21 NOTE — Patient Instructions (Signed)

## 2021-07-21 NOTE — Progress Notes (Signed)
? ?  Follow-Up Visit ?  ?Subjective  ?Connor Cruz is a 71 y.o. male who presents for the following: Actinic Keratosis (Scalp - S/P 5FU/Calcipotriene mix BID x 7 days ). ?The patient has spots, moles and lesions to be evaluated, some may be new or changing. ? ?The following portions of the chart were reviewed this encounter and updated as appropriate:  ? Tobacco  Allergies  Meds  Problems  Med Hx  Surg Hx  Fam Hx   ?  ?Review of Systems:  No other skin or systemic complaints except as noted in HPI or Assessment and Plan. ? ?Objective  ?Well appearing patient in no apparent distress; mood and affect are within normal limits. ? ?A focused examination was performed including the face and scalp. Relevant physical exam findings are noted in the Assessment and Plan. ? ?Scalp ?Erythematous thin papules/macules with gritty scale.  ? ? ?Assessment & Plan  ?AK (actinic keratosis) ?Scalp ? ?- Start 5FU/Calcipotriene cream to the scalp for 10-14 days this time since patient didn't experience a very significant reaction. ? ?Related Medications ?fluorouracil (EFUDEX) 5 % cream ?Apply to the scalp BID x 7 days. ? ? ?Actinic Damage - Severe, confluent actinic changes with pre-cancerous actinic keratoses  ?- Severe, chronic, not at goal, secondary to cumulative UV radiation exposure over time ?- diffuse scaly erythematous macules and papules with underlying dyspigmentation ?- Discussed Prescription "Field Treatment" for Severe, Chronic Confluent Actinic Changes with Pre-Cancerous Actinic Keratoses ?Field treatment involves treatment of an entire area of skin that has confluent Actinic Changes (Sun/ Ultraviolet light damage) and PreCancerous Actinic Keratoses by method of PhotoDynamic Therapy (PDT) and/or prescription Topical Chemotherapy agents such as 5-fluorouracil, 5-fluorouracil/calcipotriene, and/or imiquimod.  The purpose is to decrease the number of clinically evident and subclinical PreCancerous lesions to  prevent progression to development of skin cancer by chemically destroying early precancer changes that may or may not be visible.  It has been shown to reduce the risk of developing skin cancer in the treated area. As a result of treatment, redness, scaling, crusting, and open sores may occur during treatment course. One or more than one of these methods may be used and may have to be used several times to control, suppress and eliminate the PreCancerous changes. Discussed treatment course, expected reaction, and possible side effects. ?- Recommend daily broad spectrum sunscreen SPF 30+ to sun-exposed areas, reapply every 2 hours as needed.  ?- Staying in the shade or wearing long sleeves, sun glasses (UVA+UVB protection) and wide brim hats (4-inch brim around the entire circumference of the hat) are also recommended. ?- Call for new or changing lesions. ?- Start 5FU/Calcipotriene cream to the scalp for 10-14 days this time since patient didn't experience a very significant reaction.  ? ?Seborrheic Keratoses ?- Stuck-on, waxy, tan-brown papules and/or plaques  ?- Benign-appearing ?- Discussed benign etiology and prognosis. ?- Observe ?- Call for any changes ? ?Return for 6-12 mths for AK follow up . ? ?IRudell Cobb, CMA, am acting as scribe for Sarina Ser, MD . ?Documentation: I have reviewed the above documentation for accuracy and completeness, and I agree with the above. ? ?Sarina Ser, MD ? ?

## 2021-07-25 ENCOUNTER — Encounter: Payer: Self-pay | Admitting: Dermatology

## 2021-11-27 ENCOUNTER — Encounter: Payer: Medicare HMO | Admitting: Dermatology

## 2021-12-05 ENCOUNTER — Encounter: Payer: Self-pay | Admitting: *Deleted

## 2021-12-31 ENCOUNTER — Ambulatory Visit: Payer: Medicare HMO | Admitting: Urology

## 2021-12-31 ENCOUNTER — Encounter: Payer: Self-pay | Admitting: Urology

## 2021-12-31 VITALS — BP 154/97 | HR 74 | Ht 69.0 in | Wt 197.0 lb

## 2021-12-31 DIAGNOSIS — N401 Enlarged prostate with lower urinary tract symptoms: Secondary | ICD-10-CM | POA: Diagnosis not present

## 2021-12-31 DIAGNOSIS — R350 Frequency of micturition: Secondary | ICD-10-CM

## 2021-12-31 DIAGNOSIS — Z125 Encounter for screening for malignant neoplasm of prostate: Secondary | ICD-10-CM

## 2021-12-31 LAB — MICROSCOPIC EXAMINATION: Bacteria, UA: NONE SEEN

## 2021-12-31 LAB — URINALYSIS, COMPLETE
Bilirubin, UA: NEGATIVE
Glucose, UA: NEGATIVE
Ketones, UA: NEGATIVE
Leukocytes,UA: NEGATIVE
Nitrite, UA: NEGATIVE
Protein,UA: NEGATIVE
RBC, UA: NEGATIVE
Specific Gravity, UA: 1.02 (ref 1.005–1.030)
Urobilinogen, Ur: 0.2 mg/dL (ref 0.2–1.0)
pH, UA: 5.5 (ref 5.0–7.5)

## 2021-12-31 NOTE — Progress Notes (Signed)
12/31/2021 9:35 AM   Connor Cruz [birth year] [id number]  Referring provider: Dion Body, MD [facility] [place],  [place] [address]  Chief Complaint  Patient presents with   Benign Prostatic Hypertrophy    HPI: 71 year old male who presents today for further evaluation of urinary symptoms.  He was referred by his PCP for worsening urinary symptoms.  He is currently on Flomax 0.8 mg daily.  The option of finasteride or Avodart was discussed but he declined.  He does report that he is seeing this to be able to start his urinary stream a little better on Flomax then if he does not take it.  Most recent PSA 1.69 on 05/26/2021.  Today, he reports longstanding symptoms including urinary frequency, urgency.  He reports that this has been going on for years but seems to be progressing.  He denies any dysuria or gross hematuria.  No issues with UTIs.  IPSS as below.  UA today is negative  He is concerned that he has not had a rectal exam since changing PCPs.  He also mentions today that his father developed urinary retention and he is worried about progression of his urinary symptoms.  He is an avid hunter balloon and dizziness.  Results for orders placed or performed in visit on 12/31/21  Urinalysis, Complete  Result Value Ref Range   Scan Result 74ML       IPSS     Row Name 12/31/21 0930         International Prostate Symptom Score   How often have you had the sensation of not emptying your bladder? Less than 1 in 5     How often have you had to urinate less than every two hours? About half the time     How often have you found you stopped and started again several times when you urinated? Not at All     How often have you found it difficult to postpone urination? Less than 1 in 5 times     How often have you had a weak urinary stream? Less than half the time     How often have you had to strain to start urination? Less than 1  in 5 times     How many times did you typically get up at night to urinate? 3 Times     Total IPSS Score 11       Quality of Life due to urinary symptoms   If you were to spend the rest of your life with your urinary condition just the way it is now how would you feel about that? Mixed              Score:  1-7 Mild 8-19 Moderate 20-35 Severe    PMH: Past Medical History:  Diagnosis Date   Glaucoma     Surgical History: No past surgical history on file.  Home Medications:  Allergies as of 12/31/2021       Reactions   Benazepril Cough   Penicillins Rash        Medication List        Accurate as of December 31, 2021  9:35 AM. If you have any questions, ask your nurse or doctor.          STOP taking these medications    fluorouracil 5 % cream Commonly known as: EFUDEX Stopped by: Hollice Espy, MD       TAKE these medications  amLODipine 10 MG tablet Commonly known as: NORVASC Take 1 tablet (10 mg total) by mouth daily.   aspirin EC 81 MG tablet Take 1 tablet (81 mg total) by mouth daily.   losartan 50 MG tablet Commonly known as: COZAAR Take 1 tablet (50 mg total) by mouth daily.        Allergies:  Allergies  Allergen Reactions   Benazepril Cough   Penicillins Rash    Family History: Family History  Problem Relation Age of Onset   Hypertension Mother    Stroke Mother    Diabetes Father    Hypertension Father     Social History:  reports that he has never smoked. He has never used smokeless tobacco. He reports current alcohol use. He reports that he does not use drugs.   Physical Exam: BP (!) 154/97   Pulse 74   Ht '5\' 9"'$  (1.753 m)   Wt 197 lb (89.4 kg)   BMI 29.09 kg/m   Constitutional:  Alert and oriented, No acute distress. HEENT: [place] AT, moist mucus membranes.  Trachea midline, no masses. Cardiovascular: No clubbing, cyanosis, or edema. Respiratory: Normal respiratory effort, no increased work of breathing. GI:  Abdomen is soft, nontender, nondistended, no abdominal masses Rectal: Normal sphincter tone.  Significant prostamegaly, 50+ cc prostate, nontender without prostatic nodularity.  There is a rubbery what feels like submucosal BB sized lesion on the left side that does not feel to be adherent to the prostate, Skin: No rashes, bruises or suspicious lesions. Neurologic: Grossly intact, no focal deficits, moving all 4 extremities. Psychiatric: Normal mood and affect.  Laboratory Data: Lab Results  Component Value Date   WBC 6.1 03/09/2016   HGB 15.6 03/09/2016   HCT 43.3 03/09/2016   MCV 83 03/09/2016   PLT 181 03/09/2016    Lab Results  Component Value Date   CREATININE 1.01 03/09/2016     Lab Results  Component Value Date   HGBA1C 5.4 03/09/2016    Urinalysis UA today is negative    Assessment & Plan:    1. Benign prostatic hyperplasia with urinary frequency Worsening primarily irritative urinary symptoms  Urinalysis is unremarkable he is emptying reasonably well  Remains symptomatic with some improvement on Flomax 0.8.  We discussed various options including optimization of pharmacotherapy with consideration of initiation of finasteride dutasteride versus an OAB type medication to treat her storage related symptoms.  Alternatively, would more strongly recommend cystoscopy/TRUS for anatomic evaluation to rule out underlying conditions contributing to his symptoms as well as further characterization of his prostate and bladder anatomy.  He is agreeable this plan.  We discussed risk and benefits. - Urinalysis, Complete - Bladder Scan (Post Void Residual) in office  2. Prostate cancer screening Updated today  There is a submucosal nodule which is quite small, likely benign and does not appear to be adherent to the prostate.  As precaution, recommend attention to this area at the time of next colonoscopy.  Return for cysto/trus.   Hollice Espy, MD  [facility] [address], [place], [place] [address] [phone]  I spent 46 total minutes on the day of the encounter including pre-visit review of the medical record, face-to-face time with the patient, and post visit ordering of labs/imaging/tests.

## 2022-02-11 ENCOUNTER — Other Ambulatory Visit: Payer: Medicare HMO | Admitting: Urology

## 2022-02-18 ENCOUNTER — Ambulatory Visit: Payer: Medicare HMO | Admitting: Urology

## 2022-02-18 ENCOUNTER — Encounter: Payer: Self-pay | Admitting: Urology

## 2022-02-18 VITALS — BP 131/88 | HR 82 | Ht 69.0 in | Wt 197.0 lb

## 2022-02-18 DIAGNOSIS — N401 Enlarged prostate with lower urinary tract symptoms: Secondary | ICD-10-CM

## 2022-02-18 DIAGNOSIS — Q540 Hypospadias, balanic: Secondary | ICD-10-CM

## 2022-02-18 DIAGNOSIS — R350 Frequency of micturition: Secondary | ICD-10-CM

## 2022-02-18 LAB — URINALYSIS, COMPLETE
Bilirubin, UA: NEGATIVE
Glucose, UA: NEGATIVE
Ketones, UA: NEGATIVE
Leukocytes,UA: NEGATIVE
Nitrite, UA: NEGATIVE
Protein,UA: NEGATIVE
RBC, UA: NEGATIVE
Specific Gravity, UA: 1.02 (ref 1.005–1.030)
Urobilinogen, Ur: 0.2 mg/dL (ref 0.2–1.0)
pH, UA: 7 (ref 5.0–7.5)

## 2022-02-18 LAB — MICROSCOPIC EXAMINATION

## 2022-02-18 NOTE — Progress Notes (Signed)
02/18/22  Chief Complaint  Patient presents with   Cysto     HPI: 71 y.o. year-old male with refractory urinary symptoms related to BPH who presents today to the office for cystoscopy and prostate sizing.   Please see previous notes for details.     Blood pressure 131/88, pulse 82, height '5\' 9"'$  (1.753 m), weight 197 lb (89.4 kg). NED. A&Ox3.   No respiratory distress   Abd soft, NT, ND Normal phallus with bilateral descended testicles    Cystoscopy Procedure Note  Patient identification was confirmed, informed consent was obtained, and patient was prepped using Betadine solution.  Lidocaine jelly was administered per urethral meatus.    Preoperative abx where received prior to procedure.     Pre-Procedure: - Inspection reveals a normal caliber ureteral meatus.  Procedure: The flexible cystoscope was introduced without difficulty - No urethral strictures/lesions are present.  Coronal hypospadias.   - Enlarged prostate with trilobar coaptation with median lobe - Elevated bladder neck - Bilateral ureteral orifices identified - Bladder mucosa  reveals no ulcers, tumors, or lesions - No bladder stones - Mild trabeculation  Retroflexion shows small well circumscribed median lobe   Post-Procedure: - Patient did not tolerate procedure particularly well   Prostate transrectal ultrasound sizing   Informed consent was obtained after discussing risks/benefits of the procedure.  A time out was performed to ensure correct patient identity.   Pre-Procedure: -Transrectal probe was placed without difficulty -Transrectal Ultrasound performed revealing a 81.03 gm prostate measuring 3.62 x 6.84 x 6.26 cm (length) -Small well circumcised median lobe     Assessment/ Plan:  1. Benign prostatic hyperplasia with urinary frequency Today including the presence of enlarged prostate with a median lobe component which likely source of his obstructive urinary symptoms and may be  exacerbating his irritative urinary symptoms  No additional underlying pathology appreciated within his bladder which is reassuring  We discussed various options including optimizing medical management, possibly with the addition of a beta 3 agonist versus anticholinergic medication.  Possible side effects were discussed.  We also discussed TURP versus HoLEP is outlet procedures to address his median lobe and prostamegaly which could also eventually result in resolution of his irritative urinary symptoms.  He was provided literature on each of these procedures today and risk and benefits were discussed in detail.  After lengthy discussion, he would like to stay the course, no intervention planned but he was given literature about each of the above.  To let us know if he like to pursue anything alternatively. - Urinalysis, Complete  2. Coronal hypospadias Incidental, no bother.    Hollice Espy, MD

## 2022-02-18 NOTE — Patient Instructions (Addendum)
We discussed procedure was holmium Laser Enucleation of the Prostate (HoLEP)  HoLEP is a treatment for men with benign prostatic hyperplasia (BPH). The laser surgery removed blockages of urine flow, and is done without any incisions on the body.     What is HoLEP?  HoLEP is a type of laser surgery used to treat obstruction (blockage) of urine flow as a result of benign prostatic hyperplasia (BPH). In men with BPH, the prostate gland is not cancerous, but has become enlarged. An enlarged prostate can result in a number of urinary tract symptoms such as weak urinary stream, difficulty in starting urination, inability to urinate, frequent urination, or getting up at night to urinate.  HoLEP was developed in the 1990's as a more effective and less expensive surgical option for BPH, compared to other surgical options such as laser vaporization(PVP/greenlight laser), transurethral resection of the prostate(TURP), and open simple prostatectomy.   What happens during a HoLEP?  HoLEP requires general anesthesia ("asleep" throughout the procedure).   An antibiotic is given to reduce the risk of infection  A surgical instrument called a resectoscope is inserted through the urethra (the tube that carries urine from the bladder). The resectoscope has a camera that allows the surgeon to view the internal structure of the prostate gland, and to see where the incisions are being made during surgery.  The laser is inserted into the resectoscope and is used to enucleate (free up) the enlarged prostate tissue from the capsule (outer shell) and then to seal up any blood vessels. The tissue that has been removed is pushed back into the bladder.  A morcellator is placed through the resectoscope, and is used to suction out the prostate tissue that has been pushed into the bladder.  When the prostate tissue has been removed, the resectoscope is removed, and a foley catheter is placed to allow healing and drain the  urine from the bladder.     What happens after a HoLEP?  More than 90% of patients go home the same day a few hours after surgery. Less than 10% will be admitted to the hospital overnight for observation to monitor the urine, or if they have other medical problems.  Fluid is flushed through the catheter for about 1 hour after surgery to clear any blood from the urine. It is normal to have some blood in the urine after surgery. The need for blood transfusion is extremely rare.  Eating and drinking are permitted after the procedure once the patient has fully awakened from anesthesia.  The catheter is usually removed 2-3 days after surgery- the patient will come to clinic to have the catheter removed and make sure they can urinate on their own.  It is very important to drink lots of fluids after surgery for one week to keep the bladder flushed.  At first, there may be some burning with urination, but this typically improved within a few hours to days. Most patients do not have a significant amount of pain, and narcotic pain medications are rarely needed.  Symptoms of urinary frequency, urgency, and even leakage are NORMAL for the first few weeks after surgery as the bladder adjusts after having to work hard against blockage from the prostate for many years. This will improve, but can sometimes take several months.  The use of pelvic floor exercises (Kegel exercises) can help improve problems with urinary incontinence.   After catheter removal, patients will be seen at 6 weeks and 6 months for symptom check  No heavy lifting for at least 2-3 weeks after surgery, however patients can walk and do light activities the first day after surgery. Return to work time depends on occupation.    What are the advantages of HoLEP?  HoLEP has been studied in many different parts of the world and has been shown to be a safe and effective procedure. Although there are many types of BPH surgeries available,  HoLEP offers a unique advantage in being able to remove a large amount of tissue without any incisions on the body, even in very large prostates, while decreasing the risk of bleeding and providing tissue for pathology (to look for cancer). This decreases the need for blood transfusions during surgery, minimizes hospital stay, and reduces the risk of needing repeat treatment.  What are the side effects of HoLEP?  Temporary burning and bleeding during urination. Some blood may be seen in the urine for weeks after surgery and is part of the healing process.  Urinary incontinence (inability to control urine flow) is expected in all patients immediately after surgery and they should wear pads for the first few days/weeks. This typically improves over the course of several weeks. Performing Kegel exercises can help decrease leakage from stress maneuvers such as coughing, sneezing, or lifting. The rate of long term leakage is very low. Patients may also have leakage with urgency and this may be treated with medication. The risk of urge incontinence can be dependent on several factors including age, prostate size, symptoms, and other medical problems.  Retrograde ejaculation or "backwards ejaculation." In 75% of cases, the patient will not see any fluid during ejaculation after surgery.  Erectile function is generally not significantly affected.   What are the risks of HoLEP?  Injury to the urethra or development of scar tissue at a later date  Injury to the capsule of the prostate (typically treated with longer catheterization).  Injury to the bladder or ureteral orifices (where the urine from the kidney drains out)  Infection of the bladder, testes, or kidneys  Return of urinary obstruction at a later date requiring another operation (<2%)  Need for blood transfusion or re-operation due to bleeding  Failure to relieve all symptoms and/or need for prolonged catheterization after surgery  5-15%  of patients are found to have previously undiagnosed prostate cancer in their specimen. Prostate cancer can be treated after HoLEP.  Standard risks of anesthesia including blood clots, heart attacks, etc  When should I call my doctor?  Fever over 101.3 degrees  Inability to urinate, or large blood clots in the urine    Transurethral Resection of the Prostate Transurethral resection of the prostate (TURP) is the removal, or resection, of part of the prostate tissue. This procedure is done to treat an enlarged prostate gland (benign prostatic hyperplasia). The goal of TURP is to remove enough prostate tissue to allow for a normal flow of urine. The procedure will allow you to empty your bladder more completely when you urinate so that you can urinate less often. In a transurethral resection, a thin telescope with a light, a camera, and an electric cutting edge (resectoscope) is passed through the urethra and into the prostate. The opening of the urethra is at the end of the penis. Tell a health care provider about: Any allergies you have. All medicines you are taking, including vitamins, herbs, eye drops, creams, and over-the-counter medicines. Any problems you or family members have had with anesthetic medicines. Any bleeding problems you have. Any surgeries  you have had. Any medical conditions you have. Any prostate infections you have had. What are the risks? Generally, this is a safe procedure. However, problems may occur, including: Infection. Bleeding. Allergic reactions to medicines. Blood in the urine (hematuria). Damage to nearby structures or organs. Other problems may occur, but they are rare. They include: Dry ejaculation, or having no semen come out during orgasm. Erectile dysfunction, or being unable to have or keep an erection. Scarring that leads to narrowing of the urethra. This narrowing may block the flow of urine. Inability to control when you urinate  (incontinence). Deep vein thrombosis. This is a blood clot that can develop in your leg. TURP syndrome. This can happen when you lose too much sodium during or after the procedure. Some signs and symptoms of this condition include: Weakness. Headaches. Nausea or vomiting. Muscle cramping. What happens before the procedure? When to stop eating and drinking Follow instructions from your health care provider about what you may eat and drink before your procedure. These may include: 8 hours before your procedure Stop eating most foods. Do not eat meat, fried foods, or fatty foods. Eat only light foods, such as toast or crackers. All liquids are okay except energy drinks and alcohol. 6 hours before your procedure Stop eating. Drink only clear liquids, such as water, clear fruit juice, black coffee, plain tea, and sports drinks. Do not drink energy drinks or alcohol. 2 hours before your procedure Stop drinking all liquids. You may be allowed to take medicines with small sips of water. If you do not follow your health care provider's instructions, your procedure may be delayed or canceled. Medicines Ask your health care provider about: Changing or stopping your regular medicines. This is especially important if you are taking diabetes medicines or blood thinners. Taking medicines such as aspirin and ibuprofen. These medicines can thin your blood. Do not take these medicines unless your health care provider tells you to take them. Taking over-the-counter medicines, vitamins, herbs, and supplements. Surgery safety Ask your health care provider what steps will be taken to help prevent infection. These steps may include: Removing hair at the surgery site. Washing skin with a germ-killing soap. Taking antibiotic medicine. General instructions Do not use any products that contain nicotine or tobacco for at least 4 weeks before the procedure. These products include cigarettes, chewing tobacco, and  vaping devices, such as e-cigarettes. If you need help quitting, ask your health care provider. If you will be going home right after the procedure, plan to have a responsible adult: Take you home from the hospital or clinic. You will not be allowed to drive. Care for you for the time you are told. What happens during the procedure?  An IV will be inserted into one of your veins. You will be given one or more of the following: A medicine to help you relax (sedative). A medicine to make you fall asleep (general anesthetic). A medicine that is injected into your spine to numb the area below and slightly above the injection site (spinal anesthetic). Your legs will be placed in foot rests (stirrups) so that your legs are apart and your knees are bent. The resectoscope will be passed through your urethra to your prostate. Parts of your prostate will be resected using the cutting edge of the resectoscope. Fluid will be passed to rinse out the cut tissues (irrigation). The resectoscope will be removed. A small, thin tube (catheter) will be passed through your urethra and into your bladder.  The catheter will drain urine into a bag outside of your body. The procedure may vary among health care providers and hospitals. What happens after the procedure? Your blood pressure, heart rate, breathing rate, and blood oxygen level will be monitored until you leave the hospital or clinic. You will be given fluids through the IV. The IV will be removed when you start eating and drinking normally. You may have some pain. Pain medicine will be available to help you. You will have a catheter draining your urine. You may have blood in your urine. Your catheter may be kept in until your urine is clear. Your urinary drainage will be monitored. If necessary, your bladder may be rinsed out (irrigated) through your catheter. You will be encouraged to walk around as soon as possible. You may have to wear compression  stockings. These stockings help to prevent blood clots and reduce swelling in your legs. If you were given a sedative during the procedure, it can affect you for several hours. Do not drive or operate machinery until your health care provider says that it is safe. Summary Transurethral resection of the prostate (TURP) is the removal (resection) of part of the prostate tissue. The goal of this procedure is to remove enough prostate tissue to allow for a normal flow of urine. Follow instructions from your health care provider about taking medicines and about eating and drinking before the procedure. This information is not intended to replace advice given to you by your health care provider. Make sure you discuss any questions you have with your health care provider. Document Revised: 12/17/2020 Document Reviewed: 12/17/2020 [employer] Patient Education  [place].

## 2022-04-15 ENCOUNTER — Ambulatory Visit: Payer: Medicare HMO | Admitting: Dermatology

## 2022-05-21 ENCOUNTER — Ambulatory Visit: Payer: Medicare HMO | Admitting: Dermatology

## 2022-06-04 ENCOUNTER — Ambulatory Visit: Payer: Medicare HMO | Admitting: Dermatology

## 2022-06-04 ENCOUNTER — Encounter: Payer: Self-pay | Admitting: Dermatology

## 2022-06-04 VITALS — BP 102/67 | HR 72

## 2022-06-04 DIAGNOSIS — Z5111 Encounter for antineoplastic chemotherapy: Secondary | ICD-10-CM | POA: Diagnosis not present

## 2022-06-04 DIAGNOSIS — Z7189 Other specified counseling: Secondary | ICD-10-CM

## 2022-06-04 DIAGNOSIS — L578 Other skin changes due to chronic exposure to nonionizing radiation: Secondary | ICD-10-CM | POA: Diagnosis not present

## 2022-06-04 DIAGNOSIS — L570 Actinic keratosis: Secondary | ICD-10-CM | POA: Diagnosis not present

## 2022-06-04 DIAGNOSIS — Z79899 Other long term (current) drug therapy: Secondary | ICD-10-CM | POA: Diagnosis not present

## 2022-06-04 NOTE — Patient Instructions (Addendum)
Photodynamic Therapy/Red Light Therapy  Actinic keratoses are the dry, red scaly spots on the skin caused by sun damage. A portion of these spots can turn into skin cancer with time, and treating them can help prevent development of skin cancer.   Treatment of these spots requires removal of the defective skin cells. There are various ways to remove actinic keratoses, including freezing with liquid nitrogen, treatment with creams, or treatment with a blue light procedure in the office.   Photodynamic Therapy (PDT), also known as "red light therapy" is an in office procedure used to treat actinic keratoses. It works by targeting precancerous cells. After treatment, these cells peel off and are replaced by healthy ones.   For your phototherapy appointment, you will have two appointments on the day of your treatment. The first appointment will be to apply a cream to the treatment area. You will leave this cream on for 1-2 hours depending on the area being treated. The second appointment will be to shine a red light on the area for 16 minutes to kill off the precancer cells. It is common to experience a burning sensation during the treatment.  After your treatment, it will be important to keep the treated areas of skin out of the sun completely for 48-72 hours (2-3 days) to prevent having a reaction.   Common side effects include: - Burning or stinging, which may be severe and can last up to 24-72 hours after your treatment - Scaling and crusting which may last up to 2 weeks - Redness, swelling and/or peeling which can last up to 4 weeks  To Care for Your Skin After PDT/Red Light Therapy: - Wash with soap, water and shampoo as normal. - If needed, you can use cold compresses (e.g. ice packs) for comfort - If okay with your primary care doctor, you may use analgesics such as acetaminophen (tylenol) every 4-6 hours, not to exceed recommended dose - You may apply Cerave Healing Ointment, Vaseline or  Aquaphor as needed - If you have a lot of swelling you may take a Benadryl to help with this (this may cause drowsiness), not to exceed recommended dose. This may increase the risk of falls in people over 65 and may slow reaction time while driving, so it is not recommended to take before driving or operating machinery. - Sun Precautions - Wear a wide brim hat for the next week if outside  - Wear a sunblock with zinc or titanium dioxide at least SPF 50 daily  If you have any questions or concerns, please call the office and ask to speak with a nurse.   --------------------------------------------------------------------------------------------------------------  Due to recent changes in healthcare laws, you may see results of your pathology and/or laboratory studies on MyChart before the doctors have had a chance to review them. We understand that in some cases there may be results that are confusing or concerning to you. Please understand that not all results are received at the same time and often the doctors may need to interpret multiple results in order to provide you with the best plan of care or course of treatment. Therefore, we ask that you please give [place] 2 business days to thoroughly review all your results before contacting the office for clarification. Should we see a critical lab result, you will be contacted sooner.   If You Need Anything After Your Visit  If you have any questions or concerns for your doctor, please call our main line at [phone] and press  option 4 to reach your doctor's medical assistant. If no one answers, please leave a voicemail as directed and we will return your call as soon as possible. Messages left after 4 pm will be answered the following business day.   You may also send [place] a message via [place]. We typically respond to MyChart messages within 1-2 business days.  For prescription refills, please ask your pharmacy to contact our office. Our fax number  is [phone].  If you have an urgent issue when the clinic is closed that cannot wait until the next business day, you can page your doctor at the number below.    Please note that while we do our best to be available for urgent issues outside of office hours, we are not available 24/7.   If you have an urgent issue and are unable to reach [place], you may choose to seek medical care at your doctor's office, retail clinic, urgent care center, or emergency room.  If you have a medical emergency, please immediately call 911 or go to the emergency department.  Pager Numbers  - Dr. Nehemiah Massed: [phone]  - Dr. Laurence Ferrari: [phone]  - Dr. Nicole Kindred: [phone]  In the event of inclement weather, please call our main line at [phone] for an update on the status of any delays or closures.  Dermatology Medication Tips: Please keep the boxes that topical medications come in in order to help keep track of the instructions about where and how to use these. Pharmacies typically print the medication instructions only on the boxes and not directly on the medication tubes.   If your medication is too expensive, please contact our office at [phone] option 4 or send [place] a message through [place].   We are unable to tell what your co-pay for medications will be in advance as this is different depending on your insurance coverage. However, we may be able to find a substitute medication at lower cost or fill out paperwork to get insurance to cover a needed medication.   If a prior authorization is required to get your medication covered by your insurance company, please allow [place] 1-2 business days to complete this process.  Drug prices often vary depending on where the prescription is filled and some pharmacies may offer cheaper prices.  The website www.goodrx.com contains coupons for medications through different pharmacies. The prices here do not account for what the cost may be with help from  insurance (it may be cheaper with your insurance), but the website can give you the price if you did not use any insurance.  - You can print the associated coupon and take it with your prescription to the pharmacy.  - You may also stop by our office during regular business hours and pick up a GoodRx coupon card.  - If you need your prescription sent electronically to a different pharmacy, notify our office through [facility] or by phone at [phone] option 4.     Si Usted Necesita Algo Despus de Su Visita  Tambin puede enviarnos un mensaje a travs de Pharmacist, community. Por lo general respondemos a los mensajes de MyChart en el transcurso de 1 a 2 das hbiles.  Para renovar recetas, por favor pida a su farmacia que se ponga en contacto con nuestra oficina. Harland Dingwall de fax es [place] [phone].  Si tiene un asunto urgente cuando la clnica est cerrada y que no puede esperar hasta el siguiente da hbil, puede llamar/localizar a su doctor(a) al [employer]  aparece a continuacin.   Por favor, tenga en cuenta que aunque hacemos todo lo posible para estar disponibles para asuntos urgentes fuera del horario de [place], no estamos disponibles las 24 horas del da, los 7 das de la [place].   Si tiene un problema urgente y no puede comunicarse con nosotros, puede optar por buscar atencin mdica  en el consultorio de su doctor(a), en una clnica privada, en un centro de atencin urgente o en una sala de emergencias.  Si tiene Engineering geologist, por favor llame inmediatamente al 911 o vaya a la sala de emergencias.  Nmeros de bper  - Dr. Nehemiah Massed: [phone]  - Dra. Moye: [phone]  - Dra. Nicole Kindred: [phone]  En caso de inclemencias del [place], por favor llame a Johnsie Kindred principal al [phone] para una actualizacin sobre el [place] de cualquier retraso o cierre.  Consejos para la medicacin en dermatologa: Por favor, guarde las cajas en las que vienen los  medicamentos de uso tpico para ayudarle a seguir las instrucciones sobre dnde y cmo usarlos. Las farmacias generalmente imprimen las instrucciones del medicamento slo en las cajas y no directamente en los tubos del [place].   Si su medicamento es muy caro, por favor, pngase en contacto con Zigmund Daniel llamando al [phone] y presione la opcin 4 o envenos un mensaje a travs de Pharmacist, community.   No podemos decirle cul ser su copago por los medicamentos por adelantado ya que esto es diferente dependiendo de la cobertura de su seguro. Sin embargo, es posible que podamos encontrar un medicamento sustituto a Electrical engineer un formulario para que el seguro cubra el medicamento que se considera necesario.   Si se requiere una autorizacin previa para que su compaa de seguros [place] su medicamento, por favor permtanos de 1 a 2 das hbiles para completar este proceso.  Los precios de los medicamentos varan con frecuencia dependiendo del Environmental consultant de dnde se surte la receta y alguna farmacias pueden ofrecer precios ms baratos.  El sitio web www.goodrx.com tiene cupones para medicamentos de Airline pilot. Los precios aqu no tienen en cuenta lo que podra costar con la ayuda del seguro (puede ser ms barato con su seguro), pero el sitio web puede darle el precio si no utiliz Research scientist (physical sciences).  - Puede imprimir el cupn correspondiente y llevarlo con su receta a la farmacia.  - Tambin puede pasar por nuestra oficina durante el horario de atencin regular y Charity fundraiser una tarjeta de cupones de GoodRx.  - Si necesita que su receta se enve electrnicamente a una farmacia diferente, informe a nuestra oficina a travs de MyChart de [place] o por telfono llamando al [phone] y presione la opcin 4.

## 2022-06-04 NOTE — Progress Notes (Signed)
   Follow-Up Visit   Subjective  Connor Cruz is a 72 y.o. male who presents for the following: Actinic Keratosis (S/P 5FU/Calcipotriene mix BID x 7 days to the scalp twice since the last visit). The patient has spots, moles and lesions to be evaluated, some may be new or changing.  The following portions of the chart were reviewed this encounter and updated as appropriate:   Tobacco  Allergies  Meds  Problems  Med Hx  Surg Hx  Fam Hx     Review of Systems:  No other skin or systemic complaints except as noted in HPI or Assessment and Plan.  Objective  Well appearing patient in no apparent distress; mood and affect are within normal limits.  A focused examination was performed including the face and scalp. Relevant physical exam findings are noted in the Assessment and Plan.  Scalp x 7 (7) Erythematous thin papules/macules with gritty scale.    Assessment & Plan  AK (actinic keratosis) (7) Scalp x 7  Destruction of lesion - Scalp x 7 Complexity: simple   Destruction method: cryotherapy   Informed consent: discussed and consent obtained   Timeout:  patient name, date of birth, surgical site, and procedure verified Lesion destroyed using liquid nitrogen: Yes   Region frozen until ice ball extended beyond lesion: Yes   Outcome: patient tolerated procedure well with no complications   Post-procedure details: wound care instructions given    Actinic Damage with PreCancerous Actinic Keratoses Counseling for Topical Chemotherapy Management: Patient exhibits: - Severe, confluent actinic changes with pre-cancerous actinic keratoses that is secondary to cumulative UV radiation exposure over time - Condition that is severe; chronic, not at goal. - diffuse scaly erythematous macules and papules with underlying dyspigmentation - Discussed Prescription "Field Treatment" topical Chemotherapy for Severe, Chronic Confluent Actinic Changes with Pre-Cancerous Actinic  Keratoses Field treatment involves treatment of an entire area of skin that has confluent Actinic Changes (Sun/ Ultraviolet light damage) and PreCancerous Actinic Keratoses by method of PhotoDynamic Therapy (PDT) and/or prescription Topical Chemotherapy agents such as 5-fluorouracil, 5-fluorouracil/calcipotriene, and/or imiquimod.  The purpose is to decrease the number of clinically evident and subclinical PreCancerous lesions to prevent progression to development of skin cancer by chemically destroying early precancer changes that may or may not be visible.  It has been shown to reduce the risk of developing skin cancer in the treated area. As a result of treatment, redness, scaling, crusting, and open sores may occur during treatment course. One or more than one of these methods may be used and may have to be used several times to control, suppress and eliminate the PreCancerous changes. Discussed treatment course, expected reaction, and possible side effects. - Recommend daily broad spectrum sunscreen SPF 30+ to sun-exposed areas, reapply every 2 hours as needed.  - Staying in the shade or wearing long sleeves, sun glasses (UVA+UVB protection) and wide brim hats (4-inch brim around the entire circumference of the hat) are also recommended. - Call for new or changing lesions. - In 4 - 6 weeks recommend red light PDT to the scalp with debridement.   Return in about 6 months (around 12/03/2022) for Ak follow up, PDT of the scalp with debridement in 4-6 weeks.  Luther Redo, CMA, am acting as scribe for Sarina Ser, MD . Documentation: I have reviewed the above documentation for accuracy and completeness, and I agree with the above.  Sarina Ser, MD

## 2022-06-11 ENCOUNTER — Encounter: Payer: Self-pay | Admitting: Dermatology

## 2022-07-09 ENCOUNTER — Ambulatory Visit (INDEPENDENT_AMBULATORY_CARE_PROVIDER_SITE_OTHER): Payer: Medicare HMO | Admitting: Dermatology

## 2022-07-09 DIAGNOSIS — L570 Actinic keratosis: Secondary | ICD-10-CM

## 2022-07-09 MED ORDER — AMINOLEVULINIC ACID HCL 10 % EX GEL
2000.0000 mg | Freq: Once | CUTANEOUS | Status: AC
  Administered 2022-07-09: 2000 mg via TOPICAL

## 2022-07-09 NOTE — Patient Instructions (Signed)

## 2022-07-09 NOTE — Progress Notes (Signed)
Patient completed red light phototherapy today.  1. AK (actinic keratosis) Scalp  Photodynamic therapy - Scalp Procedure discussed: discussed risks, benefits, side effects. and alternatives   Prep: site scrubbed/prepped with acetone   Debridement needed: Yes (performed by Physician with sand paper.  [phone]))   Location:  Scalp Number of lesions:  Multiple Type of treatment:  Red light Aminolevulinic Acid (see MAR for details): Ameluz Aminolevulinic Acid comment:  [id number] Amount of Ameluz (mg):  1 Incubation time (minutes):  120 Number of minutes under lamp:  10 Cooling:  Fan Outcome: patient tolerated procedure well with no complications   Post-procedure details: sunscreen applied and aftercare instructions given to patient    Related Medications Aminolevulinic Acid HCl 10 % GEL 2,000 mg   Dorathy DaftAmanda White, RMA Documentation: I have reviewed the above documentation for accuracy and completeness, and I agree with the above.  Armida Sansavid Purcell Jungbluth, MD

## 2022-07-11 ENCOUNTER — Encounter: Payer: Self-pay | Admitting: Dermatology

## 2022-12-23 ENCOUNTER — Ambulatory Visit: Payer: Medicare HMO | Admitting: Dermatology

## 2022-12-23 DIAGNOSIS — W908XXA Exposure to other nonionizing radiation, initial encounter: Secondary | ICD-10-CM | POA: Diagnosis not present

## 2022-12-23 DIAGNOSIS — L578 Other skin changes due to chronic exposure to nonionizing radiation: Secondary | ICD-10-CM | POA: Diagnosis not present

## 2022-12-23 DIAGNOSIS — Z5111 Encounter for antineoplastic chemotherapy: Secondary | ICD-10-CM

## 2022-12-23 DIAGNOSIS — Z7189 Other specified counseling: Secondary | ICD-10-CM

## 2022-12-23 DIAGNOSIS — L570 Actinic keratosis: Secondary | ICD-10-CM | POA: Diagnosis not present

## 2022-12-23 DIAGNOSIS — Z79899 Other long term (current) drug therapy: Secondary | ICD-10-CM

## 2022-12-23 MED ORDER — FLUOROURACIL 5 % EX CREA: TOPICAL_CREAM | CUTANEOUS | 2 refills | Status: AC

## 2022-12-23 NOTE — Progress Notes (Signed)
Follow-Up Visit   Subjective  Connor Cruz is a 72 y.o. male who presents for the following: Actinic keratosis.  The patient has spots, moles and lesions to be evaluated, some may be new or changing and the patient may have concern these could be cancer.    The following portions of the chart were reviewed this encounter and updated as appropriate: medications, allergies, medical history  Review of Systems:  No other skin or systemic complaints except as noted in HPI or Assessment and Plan.  Objective  Well appearing patient in no apparent distress; mood and affect are within normal limits.  A focused examination was performed of the following areas: Sun exposed areas, arms and head  Relevant exam findings are noted in the Assessment and Plan.  face,scalp x 16 (16) Erythematous thin papules/macules with gritty scale.     Assessment & Plan   AK (actinic keratosis) (16) face,scalp x 16  Actinic keratoses are precancerous spots that appear secondary to cumulative UV radiation exposure/sun exposure over time. They are chronic with expected duration over 1 year. A portion of actinic keratoses will progress to squamous cell carcinoma of the skin. It is not possible to reliably predict which spots will progress to skin cancer and so treatment is recommended to prevent development of skin cancer.  Recommend daily broad spectrum sunscreen SPF 30+ to sun-exposed areas, reapply every 2 hours as needed.  Recommend staying in the shade or wearing long sleeves, sun glasses (UVA+UVB protection) and wide brim hats (4-inch brim around the entire circumference of the hat). Call for new or changing lesions.   Destruction of lesion - face,scalp x 16 (16) Complexity: simple   Destruction method: cryotherapy   Informed consent: discussed and consent obtained   Timeout:  patient name, date of birth, surgical site, and procedure verified Lesion destroyed using liquid nitrogen: Yes    Region frozen until ice ball extended beyond lesion: Yes   Outcome: patient tolerated procedure well with no complications   Post-procedure details: wound care instructions given    ACTINIC DAMAGE WITH PRECANCEROUS ACTINIC KERATOSES Counseling for Topical Chemotherapy Management: Patient exhibits: - Severe, confluent actinic changes with pre-cancerous actinic keratoses that is secondary to cumulative UV radiation exposure over time - Condition that is severe; chronic, not at goal. - diffuse scaly erythematous macules and papules with underlying dyspigmentation - Discussed Prescription "Field Treatment" topical Chemotherapy for Severe, Chronic Confluent Actinic Changes with Pre-Cancerous Actinic Keratoses Field treatment involves treatment of an entire area of skin that has confluent Actinic Changes (Sun/ Ultraviolet light damage) and PreCancerous Actinic Keratoses by method of PhotoDynamic Therapy (PDT) and/or prescription Topical Chemotherapy agents such as 5-fluorouracil, 5-fluorouracil/calcipotriene, and/or imiquimod.  The purpose is to decrease the number of clinically evident and subclinical PreCancerous lesions to prevent progression to development of skin cancer by chemically destroying early precancer changes that may or may not be visible.  It has been shown to reduce the risk of developing skin cancer in the treated area. As a result of treatment, redness, scaling, crusting, and open sores may occur during treatment course. One or more than one of these methods may be used and may have to be used several times to control, suppress and eliminate the PreCancerous changes. Discussed treatment course, expected reaction, and possible side effects. - Recommend daily broad spectrum sunscreen SPF 30+ to sun-exposed areas, reapply every 2 hours as needed.  - Staying in the shade or wearing long sleeves, sun glasses (UVA+UVB protection) and wide  brim hats (4-inch brim around the entire circumference  of the hat) are also recommended. - Call for new or changing lesions.  In 6 weeks restart 5FU/Calcipotriene cream apply to scalp bid x 10 days   Return in about 6 months (around 06/22/2023) for AKS .  IAngelique Holm, CMA, am acting as scribe for Armida Sans, MD .   Documentation: I have reviewed the above documentation for accuracy and completeness, and I agree with the above.  Armida Sans, MD

## 2022-12-23 NOTE — Patient Instructions (Addendum)
Instructions for [facility]  One or more of your medications was sent to the [facility]. You will receive an email from them and can purchase the medicine through that link. It will then be mailed to your home at the address you confirmed. If for any reason you do not receive an email from them, please check your spam folder. If you still do not find the email, please let us know. [facility] phone number is [phone].     5-Fluorouracil/Calcipotriene Patient Education  In 6 weeks begin applying twice a day on scalp for 10 days   Actinic keratoses are the dry, red scaly spots on the skin caused by sun damage. A portion of these spots can turn into skin cancer with time, and treating them can help prevent development of skin cancer.   Treatment of these spots requires removal of the defective skin cells. There are various ways to remove actinic keratoses, including freezing with liquid nitrogen, treatment with creams, or treatment with a blue light procedure in the office.   5-fluorouracil cream is a topical cream used to treat actinic keratoses. It works by interfering with the growth of abnormal fast-growing skin cells, such as actinic keratoses. These cells peel off and are replaced by healthy ones.   5-fluorouracil/calcipotriene is a combination of the 5-fluorouracil cream with a vitamin D analog cream called calcipotriene. The calcipotriene alone does not treat actinic keratoses. However, when it is combined with 5-fluorouracil, it helps the 5-fluorouracil treat the actinic keratoses much faster so that the same results can be achieved with a much shorter treatment time.  INSTRUCTIONS FOR 5-FLUOROURACIL/CALCIPOTRIENE CREAM:   5-fluorouracil/calcipotriene cream typically only needs to be used for 4-7 days. A thin layer should be applied twice a day to the treatment areas recommended by your physician.   If your physician prescribed  you separate tubes of 5-fluourouracil and calcipotriene, apply a thin layer of 5-fluorouracil followed by a thin layer of calcipotriene.   Avoid contact with your eyes, nostrils, and mouth. Do not use 5-fluorouracil/calcipotriene cream on infected or open wounds.   You will develop redness, irritation and some crusting at areas where you have pre-cancer damage/actinic keratoses. IF YOU DEVELOP PAIN, BLEEDING, OR SIGNIFICANT CRUSTING, STOP THE TREATMENT EARLY - you have already gotten a good response and the actinic keratoses should clear up well.  Wash your hands after applying 5-fluorouracil 5% cream on your skin.   A moisturizer or sunscreen with a minimum SPF 30 should be applied each morning.   Once you have finished the treatment, you can apply a thin layer of Vaseline twice a day to irritated areas to soothe and calm the areas more quickly. If you experience significant discomfort, contact your physician.  For some patients it is necessary to repeat the treatment for best results.  SIDE EFFECTS: When using 5-fluorouracil/calcipotriene cream, you may have mild irritation, such as redness, dryness, swelling, or a mild burning sensation. This usually resolves within 2 weeks. The more actinic keratoses you have, the more redness and inflammation you can expect during treatment. Eye irritation has been reported rarely. If this occurs, please let us know.  If you have any trouble using this cream, please call the office. If you have any other questions about this information, please do not hesitate to ask me before you leave the office.     Due to recent changes in healthcare laws, you may see results of your pathology and/or laboratory studies  on MyChart before the doctors have had a chance to review them. We understand that in some cases there may be results that are confusing or concerning to you. Please understand that not all results are received at the same time and often the doctors may  need to interpret multiple results in order to provide you with the best plan of care or course of treatment. Therefore, we ask that you please give [place] 2 business days to thoroughly review all your results before contacting the office for clarification. Should we see a critical lab result, you will be contacted sooner.   If You Need Anything After Your Visit  If you have any questions or concerns for your doctor, please call our main line at [phone] and press option 4 to reach your doctor's medical assistant. If no one answers, please leave a voicemail as directed and we will return your call as soon as possible. Messages left after 4 pm will be answered the following business day.   You may also send [place] a message via MyChart. We typically respond to MyChart messages within 1-2 business days.  For prescription refills, please ask your pharmacy to contact our office. Our fax number is [phone].  If you have an urgent issue when the clinic is closed that cannot wait until the next business day, you can page your doctor at the number below.    Please note that while we do our best to be available for urgent issues outside of office hours, we are not available 24/7.   If you have an urgent issue and are unable to reach [place], you may choose to seek medical care at your doctor's office, retail clinic, urgent care center, or emergency room.  If you have a medical emergency, please immediately call 911 or go to the emergency department.  Pager Numbers  - Dr. Gwen Pounds: [phone]  - Dr. Roseanne Reno: [phone]  - Dr. Katrinka Blazing: [phone]   In the event of inclement weather, please call our main line at [phone] for an update on the status of any delays or closures.  Dermatology Medication Tips: Please keep the boxes that topical medications come in in order to help keep track of the instructions about where and how to use these. Pharmacies typically print the medication instructions  only on the boxes and not directly on the medication tubes.   If your medication is too expensive, please contact our office at [phone] option 4 or send [place] a message through MyChart.   We are unable to tell what your co-pay for medications will be in advance as this is different depending on your insurance coverage. However, we may be able to find a substitute medication at lower cost or fill out paperwork to get insurance to cover a needed medication.   If a prior authorization is required to get your medication covered by your insurance company, please allow [place] 1-2 business days to complete this process.  Drug prices often vary depending on where the prescription is filled and some pharmacies may offer cheaper prices.  The website www.goodrx.com contains coupons for medications through different pharmacies. The prices here do not account for what the cost may be with help from insurance (it may be cheaper with your insurance), but the website can give you the price if you did not use any insurance.  - You can print the associated coupon and take it with your prescription to the pharmacy.  - You may also stop by our office during  regular business hours and pick up a GoodRx coupon card.  - If you need your prescription sent electronically to a different pharmacy, notify our office through [facility] or by phone at [phone] option 4.     Si Usted Necesita Algo Despus de Su Visita  Tambin puede enviarnos un mensaje a travs de Clinical cytogeneticist. Por lo general respondemos a los mensajes de MyChart en el transcurso de 1 a 2 das hbiles.  Para renovar recetas, por favor pida a su farmacia que se ponga en contacto con nuestra oficina. Annie Sable de fax es [place] [phone].  Si tiene un asunto urgente cuando la clnica est cerrada y que no puede esperar hasta el siguiente da hbil, puede llamar/localizar a su doctor(a) al nmero que aparece a continuacin.   Por favor, tenga en  cuenta que aunque hacemos todo lo posible para estar disponibles para asuntos urgentes fuera del horario de [place], no estamos disponibles las 24 horas del da, los 7 [address]  [address] de la [place].   Si tiene un problema urgente y no puede comunicarse con nosotros, puede optar por buscar atencin mdica  en el consultorio de su doctor(a), en una clnica privada, en un centro de atencin urgente o en una sala de emergencias.  Si tiene Engineer, drilling, por favor llame inmediatamente al 911 o vaya a la sala de emergencias.  Nmeros de bper  - Dr. Gwen Pounds: [phone]  - Dra. Roseanne Reno: [phone]  - Dr. Katrinka Blazing: [phone]   En caso de inclemencias del tiempo, por favor llame a Lacy Duverney principal al [phone] para una actualizacin sobre el [place] de cualquier retraso o cierre.  Consejos para la medicacin en dermatologa: Por favor, guarde las cajas en las que vienen los medicamentos de uso tpico para ayudarle a seguir las instrucciones sobre dnde y cmo usarlos. Las farmacias generalmente imprimen las instrucciones del medicamento slo en las cajas y no directamente en los tubos del [place].   Si su medicamento es muy caro, por favor, pngase en contacto con Rolm Gala llamando al [phone] y presione la opcin 4 o envenos un mensaje a travs de Clinical cytogeneticist.   No podemos decirle cul ser su copago por los medicamentos por adelantado ya que esto es diferente dependiendo de la cobertura de su seguro. Sin embargo, es posible que podamos encontrar un medicamento sustituto a Audiological scientist un formulario para que el seguro cubra el medicamento que se considera necesario.   Si se requiere una autorizacin previa para que su compaa de seguros [place] su medicamento, por favor permtanos de 1 a 2 das hbiles para completar [address]  Los precios de los medicamentos varan con frecuencia dependiendo del Environmental consultant de dnde se surte la receta y alguna farmacias pueden ofrecer  precios ms baratos.  El sitio web www.goodrx.com tiene cupones para medicamentos de Health and safety inspector. Los precios aqu no tienen en cuenta lo que podra costar con la ayuda del seguro (puede ser ms barato con su seguro), pero el sitio web puede darle el precio si no utiliz Tourist information centre manager.  - Puede imprimir el cupn correspondiente y llevarlo con su receta a la farmacia.  - Tambin puede pasar por nuestra oficina durante el horario de atencin regular y Education officer, museum una tarjeta de cupones de GoodRx.  - Si necesita que su receta se enve electrnicamente a una farmacia diferente, informe a nuestra oficina a travs de MyChart de [place] o por telfono llamando al [phone] y presione la opcin 4.

## 2022-12-25 ENCOUNTER — Encounter: Payer: Self-pay | Admitting: Dermatology

## 2023-06-16 ENCOUNTER — Encounter: Payer: Self-pay | Admitting: Dermatology

## 2023-06-16 ENCOUNTER — Ambulatory Visit: Admitting: Dermatology

## 2023-06-16 DIAGNOSIS — L578 Other skin changes due to chronic exposure to nonionizing radiation: Secondary | ICD-10-CM

## 2023-06-16 DIAGNOSIS — W908XXA Exposure to other nonionizing radiation, initial encounter: Secondary | ICD-10-CM | POA: Diagnosis not present

## 2023-06-16 DIAGNOSIS — L570 Actinic keratosis: Secondary | ICD-10-CM

## 2023-06-16 DIAGNOSIS — Z7189 Other specified counseling: Secondary | ICD-10-CM

## 2023-06-16 DIAGNOSIS — L821 Other seborrheic keratosis: Secondary | ICD-10-CM

## 2023-06-16 DIAGNOSIS — D692 Other nonthrombocytopenic purpura: Secondary | ICD-10-CM

## 2023-06-16 DIAGNOSIS — L820 Inflamed seborrheic keratosis: Secondary | ICD-10-CM

## 2023-06-16 DIAGNOSIS — Z79899 Other long term (current) drug therapy: Secondary | ICD-10-CM

## 2023-06-16 DIAGNOSIS — Z5111 Encounter for antineoplastic chemotherapy: Secondary | ICD-10-CM

## 2023-06-16 NOTE — Patient Instructions (Addendum)
 Restart in mid April   5-fluorouracil/calcipotriene cream twice a day for 10 days to affected areas including scalp . Prescription sent to [facility]. Patient advised they will receive an email to purchase the medication online and have it sent to their home. Patient provided with handout reviewing treatment course and side effects and advised to call or message [place] on MyChart with any concerns.  Reviewed course of treatment and expected reaction.  Patient advised to expect inflammation and crusting and advised that erosions are possible.  Patient advised to be diligent with sun protection during and after treatment. Counseled to keep medication out of reach of children and pets.  5-Fluorouracil/Calcipotriene Patient Education   Actinic keratoses are the dry, red scaly spots on the skin caused by sun damage. A portion of these spots can turn into skin cancer with time, and treating them can help prevent development of skin cancer.   Treatment of these spots requires removal of the defective skin cells. There are various ways to remove actinic keratoses, including freezing with liquid nitrogen, treatment with creams, or treatment with a blue light procedure in the office.   5-fluorouracil cream is a topical cream used to treat actinic keratoses. It works by interfering with the growth of abnormal fast-growing skin cells, such as actinic keratoses. These cells peel off and are replaced by healthy ones.   5-fluorouracil/calcipotriene is a combination of the 5-fluorouracil cream with a vitamin D analog cream called calcipotriene. The calcipotriene alone does not treat actinic keratoses. However, when it is combined with 5-fluorouracil, it helps the 5-fluorouracil treat the actinic keratoses much faster so that the same results can be achieved with a much shorter treatment time.  INSTRUCTIONS FOR 5-FLUOROURACIL/CALCIPOTRIENE CREAM:   5-fluorouracil/calcipotriene cream typically  only needs to be used for 4-7 days. A thin layer should be applied twice a day to the treatment areas recommended by your physician.   If your physician prescribed you separate tubes of 5-fluourouracil and calcipotriene, apply a thin layer of 5-fluorouracil followed by a thin layer of calcipotriene.   Avoid contact with your eyes, nostrils, and mouth. Do not use 5-fluorouracil/calcipotriene cream on infected or open wounds.   You will develop redness, irritation and some crusting at areas where you have pre-cancer damage/actinic keratoses. IF YOU DEVELOP PAIN, BLEEDING, OR SIGNIFICANT CRUSTING, STOP THE TREATMENT EARLY - you have already gotten a good response and the actinic keratoses should clear up well.  Wash your hands after applying 5-fluorouracil 5% cream on your skin.   A moisturizer or sunscreen with a minimum SPF 30 should be applied each morning.   Once you have finished the treatment, you can apply a thin layer of Vaseline twice a day to irritated areas to soothe and calm the areas more quickly. If you experience significant discomfort, contact your physician.  For some patients it is necessary to repeat the treatment for best results.  SIDE EFFECTS: When using 5-fluorouracil/calcipotriene cream, you may have mild irritation, such as redness, dryness, swelling, or a mild burning sensation. This usually resolves within 2 weeks. The more actinic keratoses you have, the more redness and inflammation you can expect during treatment. Eye irritation has been reported rarely. If this occurs, please let us know.  If you have any trouble using this cream, please call the office. If you have any other questions about this information, please do not hesitate to ask me before you leave the office.     Actinic keratoses are precancerous spots  that appear secondary to cumulative UV radiation exposure/sun exposure over time. They are chronic with expected duration over 1 year. A portion of actinic  keratoses will progress to squamous cell carcinoma of the skin. It is not possible to reliably predict which spots will progress to skin cancer and so treatment is recommended to prevent development of skin cancer.  Recommend daily broad spectrum sunscreen SPF 30+ to sun-exposed areas, reapply every 2 hours as needed.  Recommend staying in the shade or wearing long sleeves, sun glasses (UVA+UVB protection) and wide brim hats (4-inch brim around the entire circumference of the hat). Call for new or changing lesions.    Cryotherapy Aftercare  Wash gently with soap and water everyday.   Apply Vaseline and Band-Aid daily until healed.     Seborrheic Keratosis  What causes seborrheic keratoses? Seborrheic keratoses are harmless, common skin growths that first appear during adult life.  As time goes by, more growths appear.  Some people may develop a large number of them.  Seborrheic keratoses appear on both covered and uncovered body parts.  They are not caused by sunlight.  The tendency to develop seborrheic keratoses can be inherited.  They vary in color from skin-colored to gray, brown, or even black.  They can be either smooth or have a rough, warty surface.   Seborrheic keratoses are superficial and look as if they were stuck on the skin.  Under the microscope this type of keratosis looks like layers upon layers of skin.  That is why at times the top layer may seem to fall off, but the rest of the growth remains and re-grows.    Treatment Seborrheic keratoses do not need to be treated, but can easily be removed in the office.  Seborrheic keratoses often cause symptoms when they rub on clothing or jewelry.  Lesions can be in the way of shaving.  If they become inflamed, they can cause itching, soreness, or burning.  Removal of a seborrheic keratosis can be accomplished by freezing, burning, or surgery. If any spot bleeds, scabs, or grows rapidly, please return to have it checked, as these can be  an indication of a skin cancer.    Due to recent changes in healthcare laws, you may see results of your pathology and/or laboratory studies on MyChart before the doctors have had a chance to review them. We understand that in some cases there may be results that are confusing or concerning to you. Please understand that not all results are received at the same time and often the doctors may need to interpret multiple results in order to provide you with the best plan of care or course of treatment. Therefore, we ask that you please give [place] 2 business days to thoroughly review all your results before contacting the office for clarification. Should we see a critical lab result, you will be contacted sooner.   If You Need Anything After Your Visit  If you have any questions or concerns for your doctor, please call our main line at [phone] and press option 4 to reach your doctor's medical assistant. If no one answers, please leave a voicemail as directed and we will return your call as soon as possible. Messages left after 4 pm will be answered the following business day.   You may also send [place] a message via MyChart. We typically respond to MyChart messages within 1-2 business days.  For prescription refills, please ask your pharmacy to contact our office. Our fax number  is [phone].  If you have an urgent issue when the clinic is closed that cannot wait until the next business day, you can page your doctor at the number below.    Please note that while we do our best to be available for urgent issues outside of office hours, we are not available 24/7.   If you have an urgent issue and are unable to reach [place], you may choose to seek medical care at your doctor's office, retail clinic, urgent care center, or emergency room.  If you have a medical emergency, please immediately call 911 or go to the emergency department.  Pager Numbers  - Dr. Gwen Pounds: [phone]  - Dr. Roseanne Reno:  [phone]  - Dr. Katrinka Blazing: [phone]   In the event of inclement weather, please call our main line at [phone] for an update on the status of any delays or closures.  Dermatology Medication Tips: Please keep the boxes that topical medications come in in order to help keep track of the instructions about where and how to use these. Pharmacies typically print the medication instructions only on the boxes and not directly on the medication tubes.   If your medication is too expensive, please contact our office at [phone] option 4 or send [place] a message through MyChart.   We are unable to tell what your co-pay for medications will be in advance as this is different depending on your insurance coverage. However, we may be able to find a substitute medication at lower cost or fill out paperwork to get insurance to cover a needed medication.   If a prior authorization is required to get your medication covered by your insurance company, please allow [place] 1-2 business days to complete this process.  Drug prices often vary depending on where the prescription is filled and some pharmacies may offer cheaper prices.  The website www.goodrx.com contains coupons for medications through different pharmacies. The prices here do not account for what the cost may be with help from insurance (it may be cheaper with your insurance), but the website can give you the price if you did not use any insurance.  - You can print the associated coupon and take it with your prescription to the pharmacy.  - You may also stop by our office during regular business hours and pick up a GoodRx coupon card.  - If you need your prescription sent electronically to a different pharmacy, notify our office through [facility] or by phone at [phone] option 4.     Si Usted Necesita Algo Despus de Su Visita  Tambin puede enviarnos un mensaje a travs de Clinical cytogeneticist. Por lo general respondemos a los mensajes de  MyChart en el transcurso de 1 a 2 das hbiles.  Para renovar recetas, por favor pida a su farmacia que se ponga en contacto con nuestra oficina. Annie Sable de fax es [place] [phone].  Si tiene un asunto urgente cuando la clnica est cerrada y que no puede esperar hasta el siguiente da hbil, puede llamar/localizar a su doctor(a) al nmero que aparece a continuacin.   Por favor, tenga en cuenta que aunque hacemos todo lo posible para estar disponibles para asuntos urgentes fuera del horario de [place], no estamos disponibles las 24 horas del da, los 7 [address]  [address] de la [place].   Si tiene un problema urgente y no puede comunicarse con nosotros, puede optar por buscar atencin mdica  en el consultorio de su doctor(a), en una clnica privada, en un centro de  atencin urgente o en una sala de emergencias.  Si tiene Engineer, drilling, por favor llame inmediatamente al 911 o vaya a la sala de emergencias.  Nmeros de bper  - Dr. Gwen Pounds: [phone]  - Dra. Roseanne Reno: [phone]  - Dr. Katrinka Blazing: [phone]   En caso de inclemencias del tiempo, por favor llame a Lacy Duverney principal al [phone] para una actualizacin sobre el [place] de cualquier retraso o cierre.  Consejos para la medicacin en dermatologa: Por favor, guarde las cajas en las que vienen los medicamentos de uso tpico para ayudarle a seguir las instrucciones sobre dnde y cmo usarlos. Las farmacias generalmente imprimen las instrucciones del medicamento slo en las cajas y no directamente en los tubos del [place].   Si su medicamento es muy caro, por favor, pngase en contacto con Rolm Gala llamando al [phone] y presione la opcin 4 o envenos un mensaje a travs de Clinical cytogeneticist.   No podemos decirle cul ser su copago por los medicamentos por adelantado ya que esto es diferente dependiendo de la cobertura de su seguro. Sin embargo, es posible que podamos encontrar un medicamento sustituto a Advice worker un formulario para que el seguro cubra el medicamento que se considera necesario.   Si se requiere una autorizacin previa para que su compaa de seguros [place] su medicamento, por favor permtanos de 1 a 2 das hbiles para completar [address]  Los precios de los medicamentos varan con frecuencia dependiendo del Environmental consultant de dnde se surte la receta y alguna farmacias pueden ofrecer precios ms baratos.  El sitio web www.goodrx.com tiene cupones para medicamentos de Health and safety inspector. Los precios aqu no tienen en cuenta lo que podra costar con la ayuda del seguro (puede ser ms barato con su seguro), pero el sitio web puede darle el precio si no utiliz Tourist information centre manager.  - Puede imprimir el cupn correspondiente y llevarlo con su receta a la farmacia.  - Tambin puede pasar por nuestra oficina durante el horario de atencin regular y Education officer, museum una tarjeta de cupones de GoodRx.  - Si necesita que su receta se enve electrnicamente a una farmacia diferente, informe a nuestra oficina a travs de MyChart de [place] o por telfono llamando al [phone] y presione la opcin 4.

## 2023-06-16 NOTE — Progress Notes (Signed)
 Follow-Up Visit   Subjective  Connor Cruz is a 73 y.o. male who presents for the following:  Patient used 5 f./u cream to scalp for 10 days back in September  The patient has spots, moles and lesions to be evaluated, some may be new or changing and the patient may have concern these could be cancer.  The following portions of the chart were reviewed this encounter and updated as appropriate: medications, allergies, medical history  Review of Systems:  No other skin or systemic complaints except as noted in HPI or Assessment and Plan.  Objective  Well appearing patient in no apparent distress; mood and affect are within normal limits.   A focused examination was performed of the following areas: Scalp, face, back  Relevant exam findings are noted in the Assessment and Plan.  Scalp x 10 (10) Erythematous thin papules/macules with gritty scale.  Scalp x 19 (19) Erythematous stuck-on, waxy papule or plaque  Assessment & Plan   SEBORRHEIC KERATOSIS - Stuck-on, waxy, tan-brown papules and/or plaques  - Benign-appearing - Discussed benign etiology and prognosis. - Observe - Call for any changes  Purpura - Chronic; persistent and recurrent.  Treatable, but not curable. - Violaceous macules and patches - Benign - Related to trauma, age, sun damage and/or use of blood thinners, chronic use of topical and/or oral steroids - Observe - Can use OTC arnica containing moisturizer such as Dermend Bruise Formula if desired - Call for worsening or other concerns  ACTINIC KERATOSIS (10) Scalp x 10 (10) Restart in mid  April   5-fluorouracil/calcipotriene cream twice a day for 10 days to affected areas including scalp. Prescription sent to [facility]. Patient advised they will receive an email to purchase the medication online and have it sent to their home. Patient provided with handout reviewing treatment course and side effects and advised to call  or message [place] on MyChart with any concerns.  Reviewed course of treatment and expected reaction.  Patient advised to expect inflammation and crusting and advised that erosions are possible.  Patient advised to be diligent with sun protection during and after treatment. Counseled to keep medication out of reach of children and pets.  ACTINIC DAMAGE WITH PRECANCEROUS ACTINIC KERATOSES Counseling for Topical Chemotherapy Management: Patient exhibits: - Severe, confluent actinic changes with pre-cancerous actinic keratoses that is secondary to cumulative UV radiation exposure over time - Condition that is severe; chronic, not at goal. - diffuse scaly erythematous macules and papules with underlying dyspigmentation - Discussed Prescription "Field Treatment" topical Chemotherapy for Severe, Chronic Confluent Actinic Changes with Pre-Cancerous Actinic Keratoses Field treatment involves treatment of an entire area of skin that has confluent Actinic Changes (Sun/ Ultraviolet light damage) and PreCancerous Actinic Keratoses by method of PhotoDynamic Therapy (PDT) and/or prescription Topical Chemotherapy agents such as 5-fluorouracil, 5-fluorouracil/calcipotriene, and/or imiquimod.  The purpose is to decrease the number of clinically evident and subclinical PreCancerous lesions to prevent progression to development of skin cancer by chemically destroying early precancer changes that may or may not be visible.  It has been shown to reduce the risk of developing skin cancer in the treated area. As a result of treatment, redness, scaling, crusting, and open sores may occur during treatment course. One or more than one of these methods may be used and may have to be used several times to control, suppress and eliminate the PreCancerous changes. Discussed treatment course, expected reaction, and possible side effects. - Recommend daily broad spectrum sunscreen SPF  30+ to sun-exposed areas, reapply every 2 hours as  needed.  - Staying in the shade or wearing long sleeves, sun glasses (UVA+UVB protection) and wide brim hats (4-inch brim around the entire circumference of the hat) are also recommended. - Call for new or changing lesions.  Actinic keratoses are precancerous spots that appear secondary to cumulative UV radiation exposure/sun exposure over time. They are chronic with expected duration over 1 year. A portion of actinic keratoses will progress to squamous cell carcinoma of the skin. It is not possible to reliably predict which spots will progress to skin cancer and so treatment is recommended to prevent development of skin cancer.  Recommend daily broad spectrum sunscreen SPF 30+ to sun-exposed areas, reapply every 2 hours as needed.  Recommend staying in the shade or wearing long sleeves, sun glasses (UVA+UVB protection) and wide brim hats (4-inch brim around the entire circumference of the hat). Call for new or changing lesions. Destruction of lesion - Scalp x 10 (10) Complexity: simple   Destruction method: cryotherapy   Informed consent: discussed and consent obtained   Timeout:  patient name, date of birth, surgical site, and procedure verified Lesion destroyed using liquid nitrogen: Yes   Region frozen until ice ball extended beyond lesion: Yes   Outcome: patient tolerated procedure well with no complications   Post-procedure details: wound care instructions given   INFLAMED SEBORRHEIC KERATOSIS (19) Scalp x 19 (19) Symptomatic, irritating, patient would like treated. Destruction of lesion - Scalp x 19 (19) Complexity: simple   Destruction method: cryotherapy   Informed consent: discussed and consent obtained   Timeout:  patient name, date of birth, surgical site, and procedure verified Lesion destroyed using liquid nitrogen: Yes   Region frozen until ice ball extended beyond lesion: Yes   Outcome: patient tolerated procedure well with no complications   Post-procedure details: wound  care instructions given    Return for january tbse hx of aks.  IAsher Muir, CMA, am acting as scribe for Armida Sans, MD.   Documentation: I have reviewed the above documentation for accuracy and completeness, and I agree with the above.  Armida Sans, MD

## 2023-06-23 ENCOUNTER — Ambulatory Visit: Payer: Medicare HMO | Admitting: Dermatology

## 2024-04-12 ENCOUNTER — Ambulatory Visit: Admitting: Dermatology

## 2024-04-12 ENCOUNTER — Encounter: Payer: Self-pay | Admitting: Dermatology

## 2024-04-12 DIAGNOSIS — L570 Actinic keratosis: Secondary | ICD-10-CM | POA: Diagnosis not present

## 2024-04-12 DIAGNOSIS — D1801 Hemangioma of skin and subcutaneous tissue: Secondary | ICD-10-CM | POA: Diagnosis not present

## 2024-04-12 DIAGNOSIS — Z1283 Encounter for screening for malignant neoplasm of skin: Secondary | ICD-10-CM

## 2024-04-12 DIAGNOSIS — L814 Other melanin hyperpigmentation: Secondary | ICD-10-CM | POA: Diagnosis not present

## 2024-04-12 DIAGNOSIS — L821 Other seborrheic keratosis: Secondary | ICD-10-CM

## 2024-04-12 DIAGNOSIS — W908XXA Exposure to other nonionizing radiation, initial encounter: Secondary | ICD-10-CM

## 2024-04-12 DIAGNOSIS — L578 Other skin changes due to chronic exposure to nonionizing radiation: Secondary | ICD-10-CM | POA: Diagnosis not present

## 2024-04-12 DIAGNOSIS — L820 Inflamed seborrheic keratosis: Secondary | ICD-10-CM

## 2024-04-12 DIAGNOSIS — D692 Other nonthrombocytopenic purpura: Secondary | ICD-10-CM

## 2024-04-12 DIAGNOSIS — Z5111 Encounter for antineoplastic chemotherapy: Secondary | ICD-10-CM

## 2024-04-12 DIAGNOSIS — Z7189 Other specified counseling: Secondary | ICD-10-CM

## 2024-04-12 DIAGNOSIS — D229 Melanocytic nevi, unspecified: Secondary | ICD-10-CM

## 2024-04-12 DIAGNOSIS — D485 Neoplasm of uncertain behavior of skin: Secondary | ICD-10-CM

## 2024-04-12 NOTE — Progress Notes (Signed)
 "  Follow-Up Visit   Subjective  Connor Cruz is a 74 y.o. male who presents for the following: Skin Cancer Screening and Full Body Skin Exam  The patient presents for Total-Body Skin Exam (TBSE) for skin cancer screening and mole check. The patient has spots, moles and lesions to be evaluated, some may be new or changing and the patient may have concern these could be cancer.  The following portions of the chart were reviewed this encounter and updated as appropriate: medications, allergies, medical history  Review of Systems:  No other skin or systemic complaints except as noted in HPI or Assessment and Plan.  Objective  Well appearing patient in no apparent distress; mood and affect are within normal limits.  A full examination was performed including scalp, head, eyes, ears, nose, lips, neck, chest, axillae, abdomen, back, buttocks, bilateral upper extremities, bilateral lower extremities, hands, feet, fingers, toes, fingernails, and toenails. All findings within normal limits unless otherwise noted below.   Relevant physical exam findings are noted in the Assessment and Plan.  Scalp x 16 (16) Erythematous thin papules/macules with gritty scale.  scalp x 2, face x 5, back x 15 (22) Erythematous stuck-on, waxy papule or plaque  Assessment & Plan   SKIN CANCER SCREENING PERFORMED TODAY.  ACTINIC DAMAGE - Chronic condition, secondary to cumulative UV/sun exposure - diffuse scaly erythematous macules with underlying dyspigmentation - Recommend daily broad spectrum sunscreen SPF 30+ to sun-exposed areas, reapply every 2 hours as needed.  - Staying in the shade or wearing long sleeves, sun glasses (UVA+UVB protection) and wide brim hats (4-inch brim around the entire circumference of the hat) are also recommended for sun protection.  - Call for new or changing lesions.  LENTIGINES, SEBORRHEIC KERATOSES, HEMANGIOMAS - Benign normal skin lesions - Benign-appearing - Call for  any changes  MELANOCYTIC NEVI - Tan-brown and/or pink-flesh-colored symmetric macules and papules - Benign appearing on exam today - Observation - Call clinic for new or changing moles - Recommend daily use of broad spectrum spf 30+ sunscreen to sun-exposed areas.  ACTINIC KERATOSIS (16) Scalp x 16 (16) start in mid February 2026:  5-fluorouracil /calcipotriene cream twice a day for 14 days to affected areas including scalp. Prescription sent to [facility]. Patient advised they will receive an email to purchase the medication online and have it sent to their home. Patient provided with handout reviewing treatment course and side effects and advised to call or message us  on MyChart with any concerns.  Starting in April schedule for red light with debridement of the scalp  Reviewed course of treatment and expected reaction.  Patient advised to expect inflammation and crusting and advised that erosions are possible.  Patient advised to be diligent with sun protection during and after treatment. Counseled to keep medication out of reach of children and pets.  ACTINIC DAMAGE WITH PRECANCEROUS ACTINIC KERATOSES Counseling for Topical Chemotherapy Management: Patient exhibits: - Severe, confluent actinic changes with pre-cancerous actinic keratoses that is secondary to cumulative UV radiation exposure over time - Condition that is severe; chronic, not at goal. - diffuse scaly erythematous macules and papules with underlying dyspigmentation - Discussed Prescription Field Treatment topical Chemotherapy for Severe, Chronic Confluent Actinic Changes with Pre-Cancerous Actinic Keratoses Field treatment involves treatment of an entire area of skin that has confluent Actinic Changes (Sun/ Ultraviolet light damage) and PreCancerous Actinic Keratoses by method of PhotoDynamic Therapy (PDT) and/or prescription Topical Chemotherapy agents such as 5-fluorouracil ,  5-fluorouracil /calcipotriene, and/or imiquimod.  The purpose is to decrease the number of clinically evident and subclinical PreCancerous lesions to prevent progression to development of skin cancer by chemically destroying early precancer changes that may or may not be visible.  It has been shown to reduce the risk of developing skin cancer in the treated area. As a result of treatment, redness, scaling, crusting, and open sores may occur during treatment course. One or more than one of these methods may be used and may have to be used several times to control, suppress and eliminate the PreCancerous changes. Discussed treatment course, expected reaction, and possible side effects. - Recommend daily broad spectrum sunscreen SPF 30+ to sun-exposed areas, reapply every 2 hours as needed.  - Staying in the shade or wearing long sleeves, sun glasses (UVA+UVB protection) and wide brim hats (4-inch brim around the entire circumference of the hat) are also recommended. - Call for new or changing lesions.  Actinic keratoses are precancerous spots that appear secondary to cumulative UV radiation exposure/sun exposure over time. They are chronic with expected duration over 1 year. A portion of actinic keratoses will progress to squamous cell carcinoma of the skin. It is not possible to reliably predict which spots will progress to skin cancer and so treatment is recommended to prevent development of skin cancer.  Recommend daily broad spectrum sunscreen SPF 30+ to sun-exposed areas, reapply every 2 hours as needed.  Recommend staying in the shade or wearing long sleeves, sun glasses (UVA+UVB protection) and wide brim hats (4-inch brim around the entire circumference of the hat). Call for new or changing lesions. - Destruction of lesion - Scalp x 16 (16) Complexity: simple   Destruction method: cryotherapy   Informed consent: discussed and consent obtained   Timeout:  patient name, date of birth, surgical site,  and procedure verified Lesion destroyed using liquid nitrogen: Yes   Region frozen until ice ball extended beyond lesion: Yes   Outcome: patient tolerated procedure well with no complications   Post-procedure details: wound care instructions given    INFLAMED SEBORRHEIC KERATOSIS (22) scalp x 2, face x 5, back x 15 (22) Symptomatic, irritating, patient would like treated. - Destruction of lesion - scalp x 2, face x 5, back x 15 (22) Complexity: simple   Destruction method: cryotherapy   Informed consent: discussed and consent obtained   Timeout:  patient name, date of birth, surgical site, and procedure verified Lesion destroyed using liquid nitrogen: Yes   Region frozen until ice ball extended beyond lesion: Yes   Outcome: patient tolerated procedure well with no complications   Post-procedure details: wound care instructions given    SKIN CANCER SCREENING   ACTINIC SKIN DAMAGE   LENTIGO   MELANOCYTIC NEVUS, UNSPECIFIED LOCATION   SEBORRHEIC KERATOSIS   PURPURA   CHEMOTHERAPY MANAGEMENT, ENCOUNTER FOR   COUNSELING AND COORDINATION OF CARE   NEOPLASM OF UNCERTAIN BEHAVIOR OF SKIN    Purpura - Chronic; persistent and recurrent.  Treatable, but not curable. - Violaceous macules and patches - Benign - Related to trauma, age, sun damage and/or use of blood thinners, chronic use of topical and/or oral steroids - Observe - Can use OTC arnica containing moisturizer such as Dermend Bruise Formula if desired - Call for worsening or other concerns  Return for red light with debridement of the scalp in mid April; AK follow up in 6 mths.  LILLETTE Rosina Mayans, CMA, am acting as scribe for Alm Rhyme, MD .   Documentation: I have reviewed the  above documentation for accuracy and completeness, and I agree with the above.  Alm Rhyme, MD    "

## 2024-04-12 NOTE — Patient Instructions (Addendum)
 Photodynamic Therapy- Blue or Red Light Therapy  Actinic keratoses are the dry, red scaly spots on the skin caused by sun damage. A portion of these spots can turn into skin cancer with time, and treating them can help prevent development of skin cancer.   Treatment of these spots requires removal of the defective skin cells. There are various ways to remove actinic keratoses, including freezing with liquid nitrogen, treatment with creams, or treatment with a blue light procedure in the office.   Photodynamic Therapy (PDT), also known as blue or red light therapy is an in office procedure used to treat actinic keratoses. It works by targeting precancerous cells. After treatment, these cells peel off and are replaced by healthy ones.   For your phototherapy appointment, you will have two appointments on the day of your treatment. The first appointment will be to apply a cream to the treatment area. You will leave this cream on for 1-2 hours depending on the area being treated. The second appointment will be to shine a blue or red light on the area for 16-20 minutes to kill off the precancer cells. It is common to experience a burning sensation during the treatment.  After your treatment, it will be important to keep the treated areas of skin out of the sun completely for 48-72 hours (2-3 days) to prevent having a reaction.   Common side effects include: - Burning or stinging, which may be severe and can last up to 24-72 hours after your treatment - Scaling and crusting which may last up to 2 weeks - Redness, swelling and/or peeling which can last up to 4 weeks  To Care for Your Skin After PDT/Blue/Red Light Therapy: - Wash with soap, water and shampoo as normal. - If needed, you can use cold compresses (e.g. ice packs) for comfort - If okay with your primary care doctor, you may use analgesics such as acetaminophen (tylenol) every 4-6 hours, not to exceed recommended dose - You may apply  Cerave Healing Ointment, Vaseline or Aquaphor as needed - If you have a lot of swelling you may take a Benadryl to help with this (this may cause drowsiness), not to exceed recommended dose. This may increase the risk of falls in people over 65 and may slow reaction time while driving, so it is not recommended to take before driving or operating machinery. - Sun Precautions - Wear a wide brim hat for the next week if outside  - Wear a sunblock with zinc or titanium dioxide at least SPF 50 daily  If you have any questions or concerns, please call the office and ask to speak with a nurse.   --------------------------------------------------------------------------------------------------------------    Due to recent changes in healthcare laws, you may see results of your pathology and/or laboratory studies on MyChart before the doctors have had a chance to review them. We understand that in some cases there may be results that are confusing or concerning to you. Please understand that not all results are received at the same time and often the doctors may need to interpret multiple results in order to provide you with the best plan of care or course of treatment. Therefore, we ask that you please give us  2 business days to thoroughly review all your results before contacting the office for clarification. Should we see a critical lab result, you will be contacted sooner.   If You Need Anything After Your Visit  If you have any questions or concerns for your doctor,  please call our main line at [phone] and press option 4 to reach your doctor's medical assistant. If no one answers, please leave a voicemail as directed and we will return your call as soon as possible. Messages left after 4 pm will be answered the following business day.   You may also send us  a message via MyChart. We typically respond to MyChart messages within 1-2 business days.  For prescription refills, please ask your pharmacy  to contact our office. Our fax number is [phone].  If you have an urgent issue when the clinic is closed that cannot wait until the next business day, you can page your doctor at the number below.    Please note that while we do our best to be available for urgent issues outside of office hours, we are not available 24/7.   If you have an urgent issue and are unable to reach us , you may choose to seek medical care at your doctor's office, retail clinic, urgent care center, or emergency room.  If you have a medical emergency, please immediately call 911 or go to the emergency department.  Pager Numbers  - Dr. Hester: [phone]  - Dr. Jackquline: [phone]  - Dr. Claudene: [phone]   - Dr. Raymund: [phone]  In the event of inclement weather, please call our main line at [phone] for an update on the status of any delays or closures.  Dermatology Medication Tips: Please keep the boxes that topical medications come in in order to help keep track of the instructions about where and how to use these. Pharmacies typically print the medication instructions only on the boxes and not directly on the medication tubes.   If your medication is too expensive, please contact our office at [phone] option 4 or send us  a message through MyChart.   We are unable to tell what your co-pay for medications will be in advance as this is different depending on your insurance coverage. However, we may be able to find a substitute medication at lower cost or fill out paperwork to get insurance to cover a needed medication.   If a prior authorization is required to get your medication covered by your insurance company, please allow us  1-2 business days to complete this process.  Drug prices often vary depending on where the prescription is filled and some pharmacies may offer cheaper prices.  The website www.goodrx.com contains coupons for medications through different pharmacies. The  prices here do not account for what the cost may be with help from insurance (it may be cheaper with your insurance), but the website can give you the price if you did not use any insurance.  - You can print the associated coupon and take it with your prescription to the pharmacy.  - You may also stop by our office during regular business hours and pick up a GoodRx coupon card.  - If you need your prescription sent electronically to a different pharmacy, notify our office through [facility] or by phone at [phone] option 4.     Si Usted Necesita Algo Despus de Su Visita  Tambin puede enviarnos un mensaje a travs de Clinical cytogeneticist. Por lo general respondemos a los mensajes de MyChart en el transcurso de 1 a 2 das hbiles.  Para renovar recetas, por favor pida a su farmacia que se ponga en contacto con nuestra oficina. Randi lakes de fax es [place] [phone].  Si tiene un asunto urgente cuando la clnica est cerrada y que no  puede esperar hasta el siguiente da hbil, puede llamar/localizar a su doctor(a) al nmero que aparece a continuacin.   Por favor, tenga en cuenta que aunque hacemos todo lo posible para estar disponibles para asuntos urgentes fuera del horario de [place], no estamos disponibles las 24 horas del da, los 7 [address]  [address] de la [place].   Si tiene un problema urgente y no puede comunicarse con nosotros, puede optar por buscar atencin mdica  en el consultorio de su doctor(a), en una clnica privada, en un centro de atencin urgente o en una sala de emergencias.  Si tiene Engineer, drilling, por favor llame inmediatamente al 911 o vaya a la sala de emergencias.  Nmeros de bper  - Dr. Hester: [phone]  - Dra. Jackquline: [phone]  - Dr. Claudene: [phone]  - Dra. Kitts: [phone]  En caso de inclemencias del [place], por favor llame a nuestra lnea principal al [phone] para una actualizacin sobre el estado de cualquier retraso o  cierre.  Consejos para la medicacin en dermatologa: Por favor, guarde las cajas en las que vienen los medicamentos de uso tpico para ayudarle a seguir las instrucciones sobre dnde y cmo usarlos. Las farmacias generalmente imprimen las instrucciones del medicamento slo en las cajas y no directamente en los tubos del [place].   Si su medicamento es muy caro, por favor, pngase en contacto con landry rieger llamando al [phone] y presione la opcin 4 o envenos un mensaje a travs de Clinical cytogeneticist.   No podemos decirle cul ser su copago por los medicamentos por adelantado ya que esto es diferente dependiendo de la cobertura de su seguro. Sin embargo, es posible que podamos encontrar un medicamento sustituto a Audiological scientist un formulario para que el seguro cubra el medicamento que se considera necesario.   Si se requiere una autorizacin previa para que su compaa de seguros malta su medicamento, por favor permtanos de 1 a 2 das hbiles para completar este proceso.  Los precios de los medicamentos varan con frecuencia dependiendo del Environmental consultant de dnde se surte la receta y alguna farmacias pueden ofrecer precios ms baratos.  El sitio web www.goodrx.com tiene cupones para medicamentos de Health and safety inspector. Los precios aqu no tienen en cuenta lo que podra costar con la ayuda del seguro (puede ser ms barato con su seguro), pero el sitio web puede darle el precio si no utiliz Tourist information centre manager.  - Puede imprimir el cupn correspondiente y llevarlo con su receta a la farmacia.  - Tambin puede pasar por nuestra oficina durante el horario de atencin regular y Education officer, museum una tarjeta de cupones de GoodRx.  - Si necesita que su receta se enve electrnicamente a una farmacia diferente, informe a nuestra oficina a travs de MyChart de [place] o por telfono llamando al [phone] y presione la opcin 4.

## 2024-07-18 ENCOUNTER — Encounter

## 2024-07-18 ENCOUNTER — Ambulatory Visit

## 2024-10-17 ENCOUNTER — Ambulatory Visit: Admitting: Dermatology
